# Patient Record
Sex: Female | Born: 1985 | Race: Black or African American | Hispanic: No | Smoking: Former smoker
Health system: Southern US, Community
[De-identification: ages and names within clinical notes are randomized; demographics above are authoritative.]

## PROBLEM LIST (undated history)

## (undated) DIAGNOSIS — R51 Headache: Secondary | ICD-10-CM

## (undated) DIAGNOSIS — R519 Headache, unspecified: Secondary | ICD-10-CM

## (undated) DIAGNOSIS — N809 Endometriosis, unspecified: Secondary | ICD-10-CM

## (undated) HISTORY — PX: APPENDECTOMY: SHX54

## (undated) HISTORY — PX: DIAGNOSTIC LAPAROSCOPY: SUR761

## (undated) HISTORY — PX: TUBAL LIGATION: SHX77

---

## 2015-01-07 ENCOUNTER — Emergency Department
Admit: 2015-01-07 | Disposition: A | Discharge status: Home / Self Care | Payer: Self-pay | Admitting: Emergency Medicine

## 2015-01-07 LAB — COMPREHENSIVE METABOLIC PANEL
ALBUMIN: 4.1 g/dL
ALK PHOS: 63 U/L
ALT: 19 U/L
Anion Gap: 7 (ref 7–16)
BILIRUBIN TOTAL: 0.2 mg/dL — AB
BUN: 8 mg/dL
CHLORIDE: 107 mmol/L
CO2: 24 mmol/L
CREATININE: 0.76 mg/dL
Calcium, Total: 9.4 mg/dL
EGFR (African American): >60
EGFR (Non-African Amer.): >60
GLUCOSE: 90 mg/dL
POTASSIUM: 3.9 mmol/L
SGOT(AST): 20 U/L
Sodium: 138 mmol/L
Total Protein: 7.7 g/dL

## 2015-01-07 LAB — CBC WITH DIFFERENTIAL/PLATELET
BASOS ABS: 0.1 10*3/uL (ref 0.0–0.1)
Basophil %: 0.5 %
Eosinophil #: 0.1 10*3/uL (ref 0.0–0.7)
Eosinophil %: 1 %
HCT: 41.3 % (ref 35.0–47.0)
HGB: 13.5 g/dL (ref 12.0–16.0)
LYMPHS PCT: 24.8 %
Lymphocyte #: 3.3 10*3/uL (ref 1.0–3.6)
MCH: 29.1 pg (ref 26.0–34.0)
MCHC: 32.7 g/dL (ref 32.0–36.0)
MCV: 89 fL (ref 80–100)
MONOS PCT: 6.4 %
Monocyte #: 0.8 x10 3/mm (ref 0.2–0.9)
Neutrophil #: 8.8 10*3/uL — ABNORMAL HIGH (ref 1.4–6.5)
Neutrophil %: 67.3 %
Platelet: 263 10*3/uL (ref 150–440)
RBC: 4.63 10*6/uL (ref 3.80–5.20)
RDW: 14.1 % (ref 11.5–14.5)
WBC: 13.1 10*3/uL — AB (ref 3.6–11.0)

## 2015-01-07 LAB — URINALYSIS, COMPLETE
BILIRUBIN, UR: NEGATIVE
Blood: NEGATIVE
Glucose,UR: NEGATIVE mg/dL (ref 0–75)
KETONE: NEGATIVE
Leukocyte Esterase: NEGATIVE
Nitrite: NEGATIVE
PROTEIN: NEGATIVE
Ph: 6 (ref 4.5–8.0)
SPECIFIC GRAVITY: 1.024 (ref 1.003–1.030)

## 2015-01-07 LAB — TROPONIN I: Troponin-I: <0.03 ng/mL

## 2015-01-07 LAB — LIPASE, BLOOD: LIPASE: 30 U/L

## 2015-03-24 ENCOUNTER — Emergency Department: Payer: Commercial Managed Care - PPO

## 2015-03-24 ENCOUNTER — Emergency Department
Admission: EM | Admit: 2015-03-24 | Discharge: 2015-03-24 | Disposition: A | Discharge status: Home / Self Care | Payer: Commercial Managed Care - PPO | Attending: Emergency Medicine | Admitting: Emergency Medicine

## 2015-03-24 ENCOUNTER — Encounter: Payer: Self-pay | Admitting: *Deleted

## 2015-03-24 DIAGNOSIS — N8320 Unspecified ovarian cysts: Secondary | ICD-10-CM | POA: Diagnosis not present

## 2015-03-24 DIAGNOSIS — N83209 Unspecified ovarian cyst, unspecified side: Secondary | ICD-10-CM

## 2015-03-24 DIAGNOSIS — Z3202 Encounter for pregnancy test, result negative: Secondary | ICD-10-CM | POA: Insufficient documentation

## 2015-03-24 DIAGNOSIS — R1031 Right lower quadrant pain: Secondary | ICD-10-CM

## 2015-03-24 DIAGNOSIS — Z720 Tobacco use: Secondary | ICD-10-CM | POA: Insufficient documentation

## 2015-03-24 DIAGNOSIS — Z8742 Personal history of other diseases of the female genital tract: Secondary | ICD-10-CM

## 2015-03-24 HISTORY — DX: Endometriosis, unspecified: N80.9

## 2015-03-24 LAB — COMPREHENSIVE METABOLIC PANEL
ALBUMIN: 3.7 g/dL (ref 3.5–5.0)
ALT: 21 U/L (ref 14–54)
AST: 19 U/L (ref 15–41)
Alkaline Phosphatase: 59 U/L (ref 38–126)
Anion gap: 7 (ref 5–15)
BUN: 9 mg/dL (ref 6–20)
CO2: 24 mmol/L (ref 22–32)
Calcium: 9 mg/dL (ref 8.9–10.3)
Chloride: 109 mmol/L (ref 101–111)
Creatinine, Ser: 0.61 mg/dL (ref 0.44–1.00)
GFR calc Af Amer: >60 mL/min (ref >60)
GFR calc non Af Amer: >60 mL/min (ref >60)
GLUCOSE: 92 mg/dL (ref 65–99)
Potassium: 4 mmol/L (ref 3.5–5.1)
SODIUM: 140 mmol/L (ref 135–145)
TOTAL PROTEIN: 8 g/dL (ref 6.5–8.1)
Total Bilirubin: 0.3 mg/dL (ref 0.3–1.2)

## 2015-03-24 LAB — URINALYSIS COMPLETE WITH MICROSCOPIC (ARMC ONLY)
Bacteria, UA: NONE SEEN
Bilirubin Urine: NEGATIVE
Glucose, UA: NEGATIVE mg/dL
HGB URINE DIPSTICK: NEGATIVE
LEUKOCYTES UA: NEGATIVE
NITRITE: NEGATIVE
PH: 5 (ref 5.0–8.0)
PROTEIN: NEGATIVE mg/dL
Specific Gravity, Urine: 1.019 (ref 1.005–1.030)

## 2015-03-24 LAB — CBC WITH DIFFERENTIAL/PLATELET
BASOS ABS: 0.1 10*3/uL (ref 0–0.1)
BASOS PCT: 1 %
EOS ABS: 0.1 10*3/uL (ref 0–0.7)
Eosinophils Relative: 1 %
HCT: 43.1 % (ref 35.0–47.0)
Hemoglobin: 14 g/dL (ref 12.0–16.0)
Lymphocytes Relative: 22 %
Lymphs Abs: 2.2 10*3/uL (ref 1.0–3.6)
MCH: 28.8 pg (ref 26.0–34.0)
MCHC: 32.6 g/dL (ref 32.0–36.0)
MCV: 88.4 fL (ref 80.0–100.0)
Monocytes Absolute: 0.7 10*3/uL (ref 0.2–0.9)
Monocytes Relative: 7 %
Neutro Abs: 7.1 10*3/uL — ABNORMAL HIGH (ref 1.4–6.5)
Neutrophils Relative %: 69 %
PLATELETS: 247 10*3/uL (ref 150–440)
RBC: 4.87 MIL/uL (ref 3.80–5.20)
RDW: 14.3 % (ref 11.5–14.5)
WBC: 10.2 10*3/uL (ref 3.6–11.0)

## 2015-03-24 LAB — WET PREP, GENITAL
Clue Cells Wet Prep HPF POC: NONE SEEN
TRICH WET PREP: NONE SEEN
Yeast Wet Prep HPF POC: NONE SEEN

## 2015-03-24 LAB — CHLAMYDIA/NGC RT PCR (ARMC ONLY)
Chlamydia Tr: NOT DETECTED
N gonorrhoeae: NOT DETECTED

## 2015-03-24 LAB — LIPASE, BLOOD: LIPASE: 27 U/L (ref 22–51)

## 2015-03-24 LAB — POCT PREGNANCY, URINE: PREG TEST UR: NEGATIVE

## 2015-03-24 LAB — PREGNANCY, URINE: Preg Test, Ur: NEGATIVE

## 2015-03-24 MED ORDER — HYDROMORPHONE HCL 1 MG/ML IJ SOLN
1.0000 mg | Freq: Once | INTRAMUSCULAR | Status: AC
  Administered 2015-03-24: 1 mg via INTRAVENOUS

## 2015-03-24 MED ORDER — OXYCODONE-ACETAMINOPHEN 5-325 MG PO TABS
ORAL_TABLET | ORAL | Status: AC
  Filled 2015-03-24: qty 1

## 2015-03-24 MED ORDER — OXYCODONE-ACETAMINOPHEN 5-325 MG PO TABS
1.0000 | ORAL_TABLET | Freq: Once | ORAL | Status: AC
  Administered 2015-03-24: 1 via ORAL

## 2015-03-24 MED ORDER — ONDANSETRON HCL 4 MG/2ML IJ SOLN
INTRAMUSCULAR | Status: AC
  Administered 2015-03-24: 4 mg via INTRAVENOUS
  Filled 2015-03-24: qty 2

## 2015-03-24 MED ORDER — KETOROLAC TROMETHAMINE 30 MG/ML IJ SOLN
30.0000 mg | Freq: Once | INTRAMUSCULAR | Status: AC
  Administered 2015-03-24: 30 mg via INTRAVENOUS

## 2015-03-24 MED ORDER — ONDANSETRON HCL 4 MG/2ML IJ SOLN
4.0000 mg | Freq: Once | INTRAMUSCULAR | Status: AC
  Administered 2015-03-24: 4 mg via INTRAVENOUS

## 2015-03-24 MED ORDER — HYDROMORPHONE HCL 1 MG/ML IJ SOLN
INTRAMUSCULAR | Status: AC
  Administered 2015-03-24: 1 mg via INTRAVENOUS
  Filled 2015-03-24: qty 1

## 2015-03-24 MED ORDER — OXYCODONE-ACETAMINOPHEN 5-325 MG PO TABS: 1.0000 | ORAL_TABLET | ORAL | Status: DC | PRN

## 2015-03-24 MED ORDER — KETOROLAC TROMETHAMINE 30 MG/ML IJ SOLN
INTRAMUSCULAR | Status: AC
  Administered 2015-03-24: 30 mg via INTRAVENOUS
  Filled 2015-03-24: qty 1

## 2015-03-24 MED ORDER — IBUPROFEN 600 MG PO TABS: 600.0000 mg | ORAL_TABLET | Freq: Three times a day (TID) | ORAL | Status: DC | PRN

## 2015-03-24 NOTE — ED Notes (Signed)
Pt c/o RLQ abdominal pain. Pt has hx of endometriosis and associates this pain with endometriosis. Pt has not taken any meds for her pain.

## 2015-03-24 NOTE — ED Notes (Signed)
Patient transported to Ultrasound 

## 2015-03-24 NOTE — ED Provider Notes (Signed)
[facility] Emergency Department Provider Note   ____________________________________________  Time seen: 7:35 AM I have reviewed the triage vital signs and the triage nursing note.  HISTORY  Chief Complaint Abdominal Pain   Historian Patient and her husband  HPI Krystal Reeves is a 29 y.o. female who woke up around 5 AM having sharp shooting pains in her right lower quadrant suprapubically. She has had this a few years, but it is similar to when she's had endometriosis pains in the past. She is new to the area and does not have a local OB/GYN physician. She has had her appendix out. She started her gallbladder. She's had a little bit of nausea but no vomiting. She's had no constipation or diarrhea. She's had no fever. She had no vaginal discharge.    Past Medical History  Diagnosis Date  . Endometriosis     There are no active problems to display for this patient.   Past Surgical History  Procedure Laterality Date  . Appendectomy    . Tubal ligation Bilateral     Current Outpatient Rx  Name  Route  Sig  Dispense  Refill  . ibuprofen (ADVIL,MOTRIN) 600 MG tablet   Oral   Take 1 tablet (600 mg total) by mouth every 8 (eight) hours as needed.   20 tablet   0   . oxyCODONE-acetaminophen (ROXICET) 5-325 MG per tablet   Oral   Take 1 tablet by mouth every 4 (four) hours as needed for severe pain.   10 tablet   0     Allergies Review of patient's allergies indicates no known allergies.  History reviewed. No pertinent family history.  Social History History  Substance Use Topics  . Smoking status: Current Every Day Smoker    Types: Cigarettes  . Smokeless tobacco: Never Used  . Alcohol Use: Yes     Comment: occasionally    Review of Systems  Constitutional: Negative for fever. Eyes: Negative for visual changes. ENT: Negative for sore throat. Cardiovascular: Negative for chest pain. Respiratory: Negative for shortness of  breath. Gastrointestinal: Negative for vomiting and diarrhea. Genitourinary: Negative for dysuria. Musculoskeletal: Negative for back pain. Skin: Negative for rash. Neurological: Negative for headaches, focal weakness or numbness. 10 point Review of Systems otherwise negative ____________________________________________   PHYSICAL EXAM:  VITAL SIGNS: ED Triage Vitals  Enc Vitals Group     BP 03/24/15 0542 117/76 mmHg     Pulse Rate 03/24/15 0542 80     Resp --      Temp 03/24/15 0542 98.1 F (36.7 C)     Temp Source 03/24/15 0542 Oral     SpO2 03/24/15 0542 97 %     Weight --      Height --      Head Cir --      Peak Flow --      Pain Score 03/24/15 0607 10     Pain Loc --      Pain Edu? --      Excl. in GC? --      Constitutional: Alert and oriented. Well appearing and in no distress. Eyes: Conjunctivae are normal. PERRL. Normal extraocular movements. ENT   Head: Normocephalic and atraumatic.   Nose: No congestion/rhinnorhea.   Mouth/Throat: Mucous membranes are moist.   Neck: No stridor. Cardiovascular/Chest: Normal rate, regular rhythm.  No murmurs, rubs, or gallops. Respiratory: Normal respiratory effort without tachypnea nor retractions. Breath sounds are clear and equal bilaterally. No wheezes/rales/rhonchi. Gastrointestinal: Soft.  No distention, no guarding, no rebound. Moderate tenderness suprapubically and right lower quadrant.  Genitourinary/rectal: Mild Fleites vaginal discharge. Mild right adnexal tenderness but no cervical motion tenderness. Musculoskeletal: Nontender with normal range of motion in all extremities. No joint effusions.  No lower extremity tenderness nor edema. Neurologic:  Normal speech and language. No gross focal neurologic deficits are appreciated. Skin:  Skin is warm, dry and intact. No rash noted. Psychiatric: Mood and affect are normal. Speech and behavior are normal. Patient exhibits appropriate insight and  judgment.  ____________________________________________   EKG I, Governor Rooksebecca Milicent Acheampong, MD, the attending physician have personally viewed and interpreted all ECGs.  None ____________________________________________  LABS (pertinent positives/negatives)  CBC within normal limits CMP within normal limits Pregnancy test negative Wet prep with few G[place]man blood cells but otherwise negative Urinalysis negative for urinary tract infection, trace ketones  ____________________________________________  RADIOLOGY All Xrays were viewed by me. Imaging interpreted by Radiologist.  Ultrasound pelvic and transvaginal:  IMPRESSION: 1. No evidence of ovarian torsion at this time. 2. Suspect collapsed cyst in the right ovary with small volume free fluid adjacent to the right at adnexa. These imaging findings suggest the possibility of either recent ovulation, or cyst rupture. __________________________________________  PROCEDURES  Procedure(s) performed: None Critical Care performed: None  ____________________________________________   ED COURSE / ASSESSMENT AND PLAN  CONSULTATIONS: None  Pertinent labs & imaging results that were available during my care of the patient were reviewed by me and considered in my medical decision making (see chart for details).   Overall well-appearing patient who got some relief with pain control here in the emergency department. Laboratory evaluation is unremarkable and reassuring. Her ultrasound shows likely recent cyst rupture or ovulation. She has previously had her appendix out.   I will refer her to a local OB/GYN for follow-up.   Patient / Family / Caregiver informed of clinical course, medical decision-making process, and agree with plan.   I discussed return precautions, follow-up instructions, and discharged instructions with patient and/or family.  ___________________________________________   FINAL CLINICAL IMPRESSION(S) / ED  DIAGNOSES   Final diagnoses:  Right lower quadrant pain  Ovarian cyst rupture  History of endometriosis    FOLLOW UP  Referred to: OB/GYN   Governor Rooksebecca Kazzandra Desaulniers, MD 03/24/15 1018

## 2015-03-24 NOTE — ED Notes (Signed)
Pt uprite on stretcher in exam room, appears uncomfortable, grimacing; husband at bedside; pt reports awoke with right lower pelvic pain, nonradiating, no accomp symptoms; st hx endometriosis and "feels same"; st since April has had irregular periods; has recently moved here and has no GYN doctor

## 2015-03-24 NOTE — Discharge Instructions (Signed)
No certain cause was found for your pelvic pain, however I suspect this may be related to a recent ovarian cyst rupture, due to the findings suggestive of this on your ultrasound. Return to the emergency department for any new or worsening abdominal pain, black or bloody stools, vomiting, fever, or any other symptoms concerning to you. You need to follow with OB/GYN doctor, and you are referred to Dr. Jean Rosenthal.   Ovarian Cyst An ovarian cyst is a sac filled with fluid or blood. This sac is attached to the ovary. Some cysts go away on their own. Other cysts need treatment.  HOME CARE   Only take medicine as told by your doctor.  Follow up with your doctor as told.  Get regular pelvic exams and Pap tests. GET HELP IF:  Your periods are late, not regular, or painful.  You stop having periods.  Your belly (abdominal) or pelvic pain does not go away.  Your belly becomes large or puffy (swollen).  You have a hard time peeing (totally emptying your bladder).  You have pressure on your bladder.  You have pain during sex.  You feel fullness, pressure, or discomfort in your belly.  You lose weight for no reason.  You feel sick most of the time.  You have a hard time pooping (constipation).  You do not feel like eating.  You develop pimples (acne).  You have an increase in hair on your body and face.  You are gaining weight for no reason.  You think you are pregnant. GET HELP RIGHT AWAY IF:   Your belly pain gets worse.  You feel sick to your stomach (nauseous), and you throw up (vomit).  You have a fever that comes on fast.  You have belly pain while pooping (bowel movement).  Your periods are heavier than usual. MAKE SURE YOU:   Understand these instructions.  Will watch your condition.  Will get help right away if you are not doing well or get worse. Document Released: 03/01/2008 Document Revised: 07/04/2013 Document Reviewed: 05/21/2013 [facility] Patient  Information 2015 [place], [place]. This information is not intended to replace advice given to you by your health care provider. Make sure you discuss any questions you have with your health care provider.  Abdominal Pain, Women Abdominal (stomach, pelvic, or belly) pain can be caused by many things. It is important to tell your doctor:  The location of the pain.  Does it come and go or is it present all the time?  Are there things that start the pain (eating certain foods, exercise)?  Are there other symptoms associated with the pain (fever, nausea, vomiting, diarrhea)? All of this is helpful to know when trying to find the cause of the pain. CAUSES   Stomach: virus or bacteria infection, or ulcer.  Intestine: appendicitis (inflamed appendix), regional ileitis (Crohn's disease), ulcerative colitis (inflamed colon), irritable bowel syndrome, diverticulitis (inflamed diverticulum of the colon), or cancer of the stomach or intestine.  Gallbladder disease or stones in the gallbladder.  Kidney disease, kidney stones, or infection.  Pancreas infection or cancer.  Fibromyalgia (pain disorder).  Diseases of the female organs:  Uterus: fibroid (non-cancerous) tumors or infection.  Fallopian tubes: infection or tubal pregnancy.  Ovary: cysts or tumors.  Pelvic adhesions (scar tissue).  Endometriosis (uterus lining tissue growing in the pelvis and on the pelvic organs).  Pelvic congestion syndrome (female organs filling up with blood just before the menstrual period).  Pain with the menstrual period.  Pain  with ovulation (producing an egg).  Pain with an IUD (intrauterine device, birth control) in the uterus.  Cancer of the female organs.  Functional pain (pain not caused by a disease, may improve without treatment).  Psychological pain.  Depression. DIAGNOSIS  Your doctor will decide the seriousness of your pain by doing an examination.  Blood  tests.  X-rays.  Ultrasound.  CT scan (computed tomography, special type of X-ray).  MRI (magnetic resonance imaging).  Cultures, for infection.  Barium enema (dye inserted in the large intestine, to better view it with X-rays).  Colonoscopy (looking in intestine with a lighted tube).  Laparoscopy (minor surgery, looking in abdomen with a lighted tube).  Major abdominal exploratory surgery (looking in abdomen with a large incision). TREATMENT  The treatment will depend on the cause of the pain.   Many cases can be observed and treated at home.  Over-the-counter medicines recommended by your caregiver.  Prescription medicine.  Antibiotics, for infection.  Birth control pills, for painful periods or for ovulation pain.  Hormone treatment, for endometriosis.  Nerve blocking injections.  Physical therapy.  Antidepressants.  Counseling with a psychologist or psychiatrist.  Minor or major surgery. HOME CARE INSTRUCTIONS   Do not take laxatives, unless directed by your caregiver.  Take over-the-counter pain medicine only if ordered by your caregiver. Do not take aspirin because it can cause an upset stomach or bleeding.  Try a clear liquid diet (broth or water) as ordered by your caregiver. Slowly move to a bland diet, as tolerated, if the pain is related to the stomach or intestine.  Have a thermometer and take your temperature several times a day, and record it.  Bed rest and sleep, if it helps the pain.  Avoid sexual intercourse, if it causes pain.  Avoid stressful situations.  Keep your follow-up appointments and tests, as your caregiver orders.  If the pain does not go away with medicine or surgery, you may try:  Acupuncture.  Relaxation exercises (yoga, meditation).  Group therapy.  Counseling. SEEK MEDICAL CARE IF:   You notice certain foods cause stomach pain.  Your home care treatment is not helping your pain.  You need stronger pain  medicine.  You want your IUD removed.  You feel faint or lightheaded.  You develop nausea and vomiting.  You develop a rash.  You are having side effects or an allergy to your medicine. SEEK IMMEDIATE MEDICAL CARE IF:   Your pain does not go away or gets worse.  You have a fever.  Your pain is felt only in portions of the abdomen. The right side could possibly be appendicitis. The left lower portion of the abdomen could be colitis or diverticulitis.  You are passing blood in your stools (bright red or black tarry stools, with or without vomiting).  You have blood in your urine.  You develop chills, with or without a fever.  You pass out. MAKE SURE YOU:   Understand these instructions.  Will watch your condition.  Will get help right away if you are not doing well or get worse. Document Released: 07/11/2007 Document Revised: 01/28/2014 Document Reviewed: 07/31/2009 [facility] Patient Information 2015 [place], [place]. This information is not intended to replace advice given to you by your health care provider. Make sure you discuss any questions you have with your health care provider.

## 2015-04-23 ENCOUNTER — Encounter: Payer: Self-pay | Admitting: Emergency Medicine

## 2015-04-23 ENCOUNTER — Emergency Department: Payer: Commercial Managed Care - PPO

## 2015-04-23 ENCOUNTER — Emergency Department
Admission: EM | Admit: 2015-04-23 | Discharge: 2015-04-23 | Disposition: A | Discharge status: Home / Self Care | Payer: Commercial Managed Care - PPO | Attending: Student | Admitting: Student

## 2015-04-23 DIAGNOSIS — Z720 Tobacco use: Secondary | ICD-10-CM | POA: Diagnosis not present

## 2015-04-23 DIAGNOSIS — N8320 Unspecified ovarian cysts: Secondary | ICD-10-CM | POA: Diagnosis not present

## 2015-04-23 DIAGNOSIS — N83202 Unspecified ovarian cyst, left side: Secondary | ICD-10-CM

## 2015-04-23 DIAGNOSIS — Z9889 Other specified postprocedural states: Secondary | ICD-10-CM | POA: Insufficient documentation

## 2015-04-23 DIAGNOSIS — Z3202 Encounter for pregnancy test, result negative: Secondary | ICD-10-CM | POA: Insufficient documentation

## 2015-04-23 DIAGNOSIS — R1031 Right lower quadrant pain: Secondary | ICD-10-CM | POA: Diagnosis present

## 2015-04-23 DIAGNOSIS — R52 Pain, unspecified: Secondary | ICD-10-CM

## 2015-04-23 LAB — CBC WITH DIFFERENTIAL/PLATELET
BASOS PCT: 0 %
Basophils Absolute: 0.1 10*3/uL (ref 0–0.1)
Eosinophils Absolute: 0.1 10*3/uL (ref 0–0.7)
Eosinophils Relative: 1 %
HEMATOCRIT: 40.3 % (ref 35.0–47.0)
Hemoglobin: 13.4 g/dL (ref 12.0–16.0)
LYMPHS PCT: 19 %
Lymphs Abs: 2.3 10*3/uL (ref 1.0–3.6)
MCH: 29.2 pg (ref 26.0–34.0)
MCHC: 33.2 g/dL (ref 32.0–36.0)
MCV: 87.8 fL (ref 80.0–100.0)
Monocytes Absolute: 0.6 10*3/uL (ref 0.2–0.9)
Monocytes Relative: 5 %
Neutro Abs: 9.4 10*3/uL — ABNORMAL HIGH (ref 1.4–6.5)
Neutrophils Relative %: 75 %
Platelets: 233 10*3/uL (ref 150–440)
RBC: 4.59 MIL/uL (ref 3.80–5.20)
RDW: 14.3 % (ref 11.5–14.5)
WBC: 12.4 10*3/uL — AB (ref 3.6–11.0)

## 2015-04-23 LAB — COMPREHENSIVE METABOLIC PANEL
ALT: 14 U/L (ref 14–54)
AST: 17 U/L (ref 15–41)
Albumin: 3.9 g/dL (ref 3.5–5.0)
Alkaline Phosphatase: 62 U/L (ref 38–126)
Anion gap: 10 (ref 5–15)
BUN: 7 mg/dL (ref 6–20)
CO2: 21 mmol/L — ABNORMAL LOW (ref 22–32)
Calcium: 9.1 mg/dL (ref 8.9–10.3)
Chloride: 105 mmol/L (ref 101–111)
Creatinine, Ser: 0.7 mg/dL (ref 0.44–1.00)
GFR calc Af Amer: >60 mL/min (ref >60)
GFR calc non Af Amer: >60 mL/min (ref >60)
GLUCOSE: 90 mg/dL (ref 65–99)
POTASSIUM: 3.4 mmol/L — AB (ref 3.5–5.1)
Sodium: 136 mmol/L (ref 135–145)
TOTAL PROTEIN: 7.7 g/dL (ref 6.5–8.1)
Total Bilirubin: 0.5 mg/dL (ref 0.3–1.2)

## 2015-04-23 LAB — URINALYSIS COMPLETE WITH MICROSCOPIC (ARMC ONLY)
BACTERIA UA: NONE SEEN
Bilirubin Urine: NEGATIVE
GLUCOSE, UA: NEGATIVE mg/dL
Hgb urine dipstick: NEGATIVE
Leukocytes, UA: NEGATIVE
NITRITE: NEGATIVE
PROTEIN: NEGATIVE mg/dL
SPECIFIC GRAVITY, URINE: 1.027 (ref 1.005–1.030)
pH: 5 (ref 5.0–8.0)

## 2015-04-23 LAB — POCT PREGNANCY, URINE: Preg Test, Ur: NEGATIVE

## 2015-04-23 MED ORDER — MORPHINE SULFATE 4 MG/ML IJ SOLN
INTRAMUSCULAR | Status: AC
  Filled 2015-04-23: qty 1

## 2015-04-23 MED ORDER — SODIUM CHLORIDE 0.9 % IV BOLUS (SEPSIS)
1000.0000 mL | Freq: Once | INTRAVENOUS | Status: AC
  Administered 2015-04-23: 1000 mL via INTRAVENOUS

## 2015-04-23 MED ORDER — ONDANSETRON HCL 4 MG/2ML IJ SOLN
4.0000 mg | Freq: Once | INTRAMUSCULAR | Status: AC
  Administered 2015-04-23: 4 mg via INTRAVENOUS

## 2015-04-23 MED ORDER — ONDANSETRON HCL 4 MG/2ML IJ SOLN
4.0000 mg | Freq: Once | INTRAMUSCULAR | Status: AC
  Administered 2015-04-23: 4 mg via INTRAVENOUS
  Filled 2015-04-23: qty 2

## 2015-04-23 MED ORDER — OXYCODONE HCL 5 MG PO TABS
5.0000 mg | ORAL_TABLET | Freq: Four times a day (QID) | ORAL | Status: DC | PRN
Start: 2015-04-23 — End: 2015-05-13

## 2015-04-23 MED ORDER — MORPHINE SULFATE 4 MG/ML IJ SOLN
4.0000 mg | Freq: Once | INTRAMUSCULAR | Status: AC
  Administered 2015-04-23: 4 mg via INTRAVENOUS
  Filled 2015-04-23: qty 1

## 2015-04-23 MED ORDER — MORPHINE SULFATE 4 MG/ML IJ SOLN
4.0000 mg | Freq: Once | INTRAMUSCULAR | Status: AC
  Administered 2015-04-23: 4 mg via INTRAVENOUS

## 2015-04-23 MED ORDER — ONDANSETRON HCL 4 MG/2ML IJ SOLN
INTRAMUSCULAR | Status: AC
  Filled 2015-04-23: qty 2

## 2015-04-23 NOTE — ED Notes (Signed)
C/o RLQ abd. Pain that started today, states she feels like she has a "knot" in her RLQ area, also having some nausea but no vomiting, hx of endometriosis

## 2015-04-23 NOTE — ED Provider Notes (Signed)
[facility] Emergency Department Provider Note  ____________________________________________  Time seen: Approximately 5:40 PM  I have reviewed the triage vital signs and the nursing notes.   HISTORY  Chief Complaint Abdominal Pain and Nausea    HPI Krystal Reeves is a 29 y.o. female with history of endometriosis, status post appendectomy, who presents for evaluation of gradual onset sharp right lower quadrant pain today at noon, constant since onset. Associated with nausea but no vomiting, diarrhea or fever. No abnormal vaginal bleeding or vaginal discharge. She has not had a menstrual cycle since the end of June but reports that she has a history of severe irregularity when it comes to mitral cycles. Current severity of symptoms is severe area no modifying factors. She had similar pain when seen here on 03/24/2015 and had ultrasound consistent with possible ruptured cyst.   Past Medical History  Diagnosis Date  . Endometriosis     There are no active problems to display for this patient.   Past Surgical History  Procedure Laterality Date  . Appendectomy    . Tubal ligation Bilateral     Current Outpatient Rx  Name  Route  Sig  Dispense  Refill  . oxyCODONE-acetaminophen (ROXICET) 5-325 MG per tablet   Oral   Take 1 tablet by mouth every 4 (four) hours as needed for severe pain.   10 tablet   0   . ibuprofen (ADVIL,MOTRIN) 600 MG tablet   Oral   Take 1 tablet (600 mg total) by mouth every 8 (eight) hours as needed. Patient not taking: Reported on 04/23/2015   20 tablet   0     Allergies Review of patient's allergies indicates no known allergies.  No family history on file.  Social History History  Substance Use Topics  . Smoking status: Current Every Day Smoker    Types: Cigarettes  . Smokeless tobacco: Never Used  . Alcohol Use: Yes     Comment: occasionally    Review of Systems Constitutional: No fever/chills Eyes: No visual  changes. ENT: No sore throat. Cardiovascular: Denies chest pain. Respiratory: Denies shortness of breath. Gastrointestinal: + abdominal pain.  + nausea, no vomiting.  No diarrhea.  No constipation. Genitourinary: Negative for dysuria. Musculoskeletal: Negative for back pain. Skin: Negative for rash. Neurological: Negative for headaches, focal weakness or numbness.  10-point ROS otherwise negative.  ____________________________________________   PHYSICAL EXAM:  VITAL SIGNS: ED Triage Vitals  Enc Vitals Group     BP 04/23/15 1706 119/71 mmHg     Pulse Rate 04/23/15 1706 83     Resp 04/23/15 1706 18     Temp 04/23/15 1706 98.8 F (37.1 C)     Temp Source 04/23/15 1706 Oral     SpO2 04/23/15 1706 98 %     Weight 04/23/15 1706 190 lb (86.183 kg)     Height 04/23/15 1706 5\' 6"  (1.676 m)     Head Cir --      Peak Flow --      Pain Score 04/23/15 1707 8     Pain Loc --      Pain Edu? --      Excl. in GC? --     Constitutional: Alert and oriented. In mild distress secondary to pain. Eyes: Conjunctivae are normal. PERRL. EOMI. Head: Atraumatic. Nose: No congestion/rhinnorhea. Mouth/Throat: Mucous membranes are moist.  Oropharynx non-erythematous. Neck: No stridor.  Cardiovascular: Normal rate, regular rhythm. Grossly normal heart sounds.  Good peripheral circulation. Respiratory: Normal respiratory  effort.  No retractions. Lungs CTAB. Gastrointestinal: Soft with moderate RLQ tenderness and mild suprapubic tenderness. No distention. No abdominal bruits. No CVA tenderness. Genitourinary: deferrred Musculoskeletal: No lower extremity tenderness nor edema.  No joint effusions. Neurologic:  Normal speech and language. No gross focal neurologic deficits are appreciated. No gait instability. Skin:  Skin is warm, dry and intact. No rash noted. Psychiatric: Mood and affect are normal. Speech and behavior are normal.  ____________________________________________   LABS (all labs  ordered are listed, but only abnormal results are displayed)  Labs Reviewed  CBC WITH DIFFERENTIAL/PLATELET - Abnormal; Notable for the following:    WBC 12.4 (*)    Neutro Abs 9.4 (*)    All other components within normal limits  COMPREHENSIVE METABOLIC PANEL - Abnormal; Notable for the following:    Potassium 3.4 (*)    CO2 21 (*)    All other components within normal limits  URINALYSIS COMPLETEWITH MICROSCOPIC ([facility] ONLY) - Abnormal; Notable for the following:    Color, Urine YELLOW (*)    APPearance HAZY (*)    Ketones, ur TRACE (*)    Squamous Epithelial / LPF 0-5 (*)    All other components within normal limits  POCT PREGNANCY, URINE   ____________________________________________  EKG  none ____________________________________________  RADIOLOGY  Transvaginal ultrasound IMPRESSION: 1. 1.6 x 2 x 1.9 cm complex left ovarian cystic mass without internal Doppler flow which may reflect an endometrioma versus hemorrhagic cyst. 2. No ovarian torsion.   ____________________________________________   PROCEDURES  Procedure(s) performed: None  Critical Care performed: No  ____________________________________________   INITIAL IMPRESSION / ASSESSMENT AND PLAN / ED COURSE  Pertinent labs & imaging results that were available during my care of the patient were reviewed by me and considered in my medical decision making (see chart for details).  Krystal Reeves is a 29 y.o. female with history of endometriosis, status post appendectomy, who presents for evaluation of gradual onset sharp right lower quadrant pain. On exam, she is in mild distress second to pain. She has tenderness to palpation in the right lower quadrant and in the suprapubic region. Vital signs are stable, she is afebrile. Labs notable for mild leukocytosis. Negative pregnancy. UA not consistent with infection. Plan for transvaginal ultrasound to further evaluate cause of her pain/rule out torsion. We'll  treat her pain, give IV fluids, antiemetics. Reassess for disposition.  ----------------------------------------- 8:18 PM on 04/23/2015 -----------------------------------------  Pain improved significantly. Patient appears much more comfortable sitting up in bed, she is texting on her cell phone. Transvaginal ultrasound is negative for any evidence of torsion or abscess. A new complex left ovarian cystic mass is noted which may reflect endometrioma versus hemorrhagic cyst. This is new when compared to transvaginal ultrasound obtained one month ago and though it is left-sided, suspect her symptoms are related to endometriosis given similarity to prior endometriosis pain. I do not appreciate any bulge/hernia sac in the right groin or right lower quadrant, she is status post appendectomy, I do not think she requires CT of the abdomen and pelvis at this time. Discussed meticulous return precautions, need for close OB/GYN follow-up, pain control with oxycodone as prescribed. She voices understanding and is comfortable with the discharge plan. ____________________________________________   FINAL CLINICAL IMPRESSION(S) / ED DIAGNOSES  Final diagnoses:  Pain  RLQ abdominal pain  Cyst of left ovary      Gayla Doss, MD 04/23/15 2022

## 2015-05-13 ENCOUNTER — Encounter: Payer: Self-pay | Admitting: Emergency Medicine

## 2015-05-13 ENCOUNTER — Emergency Department: Payer: Commercial Managed Care - PPO

## 2015-05-13 ENCOUNTER — Emergency Department
Admission: EM | Admit: 2015-05-13 | Discharge: 2015-05-13 | Disposition: A | Discharge status: Home / Self Care | Payer: Commercial Managed Care - PPO | Attending: Emergency Medicine | Admitting: Emergency Medicine

## 2015-05-13 DIAGNOSIS — G8929 Other chronic pain: Secondary | ICD-10-CM | POA: Diagnosis not present

## 2015-05-13 DIAGNOSIS — R102 Pelvic and perineal pain: Secondary | ICD-10-CM

## 2015-05-13 DIAGNOSIS — R1030 Lower abdominal pain, unspecified: Secondary | ICD-10-CM | POA: Diagnosis present

## 2015-05-13 DIAGNOSIS — Z720 Tobacco use: Secondary | ICD-10-CM | POA: Diagnosis not present

## 2015-05-13 DIAGNOSIS — N809 Endometriosis, unspecified: Secondary | ICD-10-CM | POA: Diagnosis not present

## 2015-05-13 DIAGNOSIS — Z3202 Encounter for pregnancy test, result negative: Secondary | ICD-10-CM | POA: Insufficient documentation

## 2015-05-13 LAB — URINALYSIS COMPLETE WITH MICROSCOPIC (ARMC ONLY)
Bacteria, UA: NONE SEEN
Bilirubin Urine: NEGATIVE
Glucose, UA: NEGATIVE mg/dL
Hgb urine dipstick: NEGATIVE
KETONES UR: NEGATIVE mg/dL
Leukocytes, UA: NEGATIVE
Nitrite: NEGATIVE
PH: 8 (ref 5.0–8.0)
Protein, ur: NEGATIVE mg/dL
Specific Gravity, Urine: 1.015 (ref 1.005–1.030)

## 2015-05-13 LAB — CBC
HCT: 41.5 % (ref 35.0–47.0)
Hemoglobin: 13.8 g/dL (ref 12.0–16.0)
MCH: 29.1 pg (ref 26.0–34.0)
MCHC: 33.2 g/dL (ref 32.0–36.0)
MCV: 87.8 fL (ref 80.0–100.0)
PLATELETS: 244 10*3/uL (ref 150–440)
RBC: 4.73 MIL/uL (ref 3.80–5.20)
RDW: 14.4 % (ref 11.5–14.5)
WBC: 8.4 10*3/uL (ref 3.6–11.0)

## 2015-05-13 LAB — COMPREHENSIVE METABOLIC PANEL
ALK PHOS: 62 U/L (ref 38–126)
ALT: 15 U/L (ref 14–54)
AST: 18 U/L (ref 15–41)
Albumin: 3.6 g/dL (ref 3.5–5.0)
Anion gap: 6 (ref 5–15)
BUN: 9 mg/dL (ref 6–20)
CO2: 25 mmol/L (ref 22–32)
CREATININE: 0.63 mg/dL (ref 0.44–1.00)
Calcium: 8.8 mg/dL — ABNORMAL LOW (ref 8.9–10.3)
Chloride: 104 mmol/L (ref 101–111)
Glucose, Bld: 94 mg/dL (ref 65–99)
Potassium: 3.8 mmol/L (ref 3.5–5.1)
Sodium: 135 mmol/L (ref 135–145)
Total Bilirubin: 0.6 mg/dL (ref 0.3–1.2)
Total Protein: 7.4 g/dL (ref 6.5–8.1)

## 2015-05-13 LAB — PREGNANCY, URINE: Preg Test, Ur: NEGATIVE

## 2015-05-13 LAB — LIPASE, BLOOD: Lipase: 16 U/L — ABNORMAL LOW (ref 22–51)

## 2015-05-13 MED ORDER — KETOROLAC TROMETHAMINE 30 MG/ML IJ SOLN
30.0000 mg | Freq: Once | INTRAMUSCULAR | Status: AC
  Administered 2015-05-13: 30 mg via INTRAVENOUS

## 2015-05-13 MED ORDER — IBUPROFEN 800 MG PO TABS: 800.0000 mg | ORAL_TABLET | Freq: Three times a day (TID) | ORAL | Status: DC | PRN

## 2015-05-13 MED ORDER — KETOROLAC TROMETHAMINE 30 MG/ML IJ SOLN
INTRAMUSCULAR | Status: AC
  Filled 2015-05-13: qty 1

## 2015-05-13 MED ORDER — OXYCODONE-ACETAMINOPHEN 5-325 MG PO TABS: 1.0000 | ORAL_TABLET | Freq: Four times a day (QID) | ORAL | Status: DC | PRN

## 2015-05-13 MED ORDER — MORPHINE SULFATE (PF) 4 MG/ML IV SOLN
4.0000 mg | Freq: Once | INTRAVENOUS | Status: AC
  Administered 2015-05-13: 4 mg via INTRAVENOUS
  Filled 2015-05-13: qty 1

## 2015-05-13 MED ORDER — SODIUM CHLORIDE 0.9 % IV BOLUS (SEPSIS)
1000.0000 mL | Freq: Once | INTRAVENOUS | Status: AC
  Administered 2015-05-13: 1000 mL via INTRAVENOUS

## 2015-05-13 NOTE — Discharge Instructions (Signed)
Take motrin for pain.   Take percocet for severe pain. Do NOT drive with it.   Follow up with your GYN doctor regarding treatment options.  Return to ER if you have worse pain, vomiting, fevers.

## 2015-05-13 NOTE — ED Notes (Signed)
Pt has negative pregnancy test, [place] called and made aware

## 2015-05-13 NOTE — ED Notes (Signed)
Pt to ed with c/o abd pain x 2 weeks, also reports nausea and diarrhea, but denies vomiting. Pt states hx of endometriosis.

## 2015-05-13 NOTE — ED Provider Notes (Signed)
CSN: [id number]     Arrival date & time 05/13/15  4098 History   First MD Initiated Contact with Patient 05/13/15 1026     Chief Complaint  Patient presents with  . Abdominal Pain     (Consider location/radiation/quality/duration/timing/severity/associated sxs/prior Treatment) The history is provided by the patient.  Krystal Reeves is a 29 y.o. female hx of endometriosis here with lower abdominal pain. Patient has hx of endometriosis and has chronic pelvic pain. Intermittent lower abdominal pain for the last 2 weeks. Seen 2 weeks ago and had endometrioma on the left side. Prescribed percocet, which helped somewhat. Denies vomiting or fever or urinary symptoms. Has been trying to get follow up with GYN doctor.    Past Medical History  Diagnosis Date  . Endometriosis    Past Surgical History  Procedure Laterality Date  . Appendectomy    . Tubal ligation Bilateral    History reviewed. No pertinent family history. Social History  Substance Use Topics  . Smoking status: Current Every Day Smoker    Types: Cigarettes  . Smokeless tobacco: Never Used  . Alcohol Use: Yes     Comment: occasionally   OB History    Gravida Para Term Preterm AB TAB SAB Ectopic Multiple Living   Review of Systems  Gastrointestinal: Positive for abdominal pain.  All other systems reviewed and are negative.     Allergies  Review of patient's allergies indicates no known allergies.  Home Medications   Prior to Admission medications   Not on File   BP 116/96 mmHg  Pulse 59  Temp(Src) 98.1 F (36.7 C) (Oral)  Resp 18  Ht  (1.676 m)  Wt 190 lb (86.183 kg)  BMI 30.68 kg/m2  SpO2 98%  LMP 04/25/2015 Physical Exam  Constitutional: She is oriented to person, place, and time. She appears well-developed.  Uncomfortable   HENT:  Head: Normocephalic.  Mouth/Throat: Oropharynx is clear and moist.  Eyes: Conjunctivae are normal. Pupils are equal, round, and reactive to light.   Neck: Normal range of motion. Neck supple.  Cardiovascular: Normal rate, regular rhythm and normal heart sounds.   Pulmonary/Chest: Effort normal and breath sounds normal. No respiratory distress. She has no wheezes. She has no rales.  Abdominal: Soft. Bowel sounds are normal.  + diffuse lower abdominal tenderness, worse on L side, no rebound   Musculoskeletal: Normal range of motion. She exhibits no edema or tenderness.  Neurological: She is alert and oriented to person, place, and time. No cranial nerve deficit. Coordination normal.  Skin: Skin is warm and dry.  Psychiatric: She has a normal mood and affect. Her behavior is normal. Judgment and thought content normal.  Nursing note and vitals reviewed.   ED Course  Procedures (including critical care time) Labs Review Labs Reviewed  LIPASE, BLOOD  COMPREHENSIVE METABOLIC PANEL  CBC  URINALYSIS COMPLETEWITH MICROSCOPIC [facility] ONLY)  PREGNANCY, URINE    Imaging Review US Transvaginal Non-ob  05/13/2015   CLINICAL DATA:  Left pelvic pain. History of endometriosis. Last menstrual period 04/25/2015  EXAM: TRANSABDOMINAL AND TRANSVAGINAL ULTRASOUND OF PELVIS  DOPPLER ULTRASOUND OF OVARIES  TECHNIQUE: Both transabdominal and transvaginal ultrasound examinations of the pelvis were performed. Transabdominal technique was performed for global imaging of the pelvis including uterus, ovaries, adnexal regions, and pelvic cul-de-sac.  It was necessary to proceed with endovaginal exam following the transabdominal exam to visualize the endometrium and bilateral ovaries.  Color and duplex Doppler ultrasound was utilized to evaluate blood flow to the ovaries.  COMPARISON:  04/23/2015  FINDINGS: Uterus  Measurements: 9.3 x 5.2 x 5.3 cm. No fibroids or other mass visualized.  Endometrium  Thickness: 1.4 cm.  No focal abnormality visualized.  Right ovary  Measurements: 3.3 by 2.3 by 3.3 cm. Normal appearance / no adnexal mass.  Left ovary  Measurements: 3.8  by 2.3 by 3.8. The previously seen left ovarian thick-walled cystic structure now measures 1.9 x 1.3 x 1.9 cm, which represents a mild decrease in size. No intramural blood flow is seen.  Pulsed Doppler evaluation of both ovaries demonstrates normal low-resistance arterial and venous waveforms.  Other findings  There is a trace amount of free pelvic fluid.  IMPRESSION: Slightly decreased in size thick-walled cystic structure on the left ovary, which may represent a degenerating cyst versus less likely an endometrioma. Follow-up with a pelvic ultrasound in 6 months may be considered if found clinically necessary.  Normal appearance of the right ovary and the uterus.  Normal blood supply with appropriate waveforms of flow velocity of the ovaries.   Electronically Signed   By: Ted Mcalpine M.D.   On: 05/13/2015 13:34   US Pelvis Complete  05/13/2015   CLINICAL DATA:  Left pelvic pain. History of endometriosis. Last menstrual period 04/25/2015  EXAM: TRANSABDOMINAL AND TRANSVAGINAL ULTRASOUND OF PELVIS  DOPPLER ULTRASOUND OF OVARIES  TECHNIQUE: Both transabdominal and transvaginal ultrasound examinations of the pelvis were performed. Transabdominal technique was performed for global imaging of the pelvis including uterus, ovaries, adnexal regions, and pelvic cul-de-sac.  It was necessary to proceed with endovaginal exam following the transabdominal exam to visualize the endometrium and bilateral ovaries. Color and duplex Doppler ultrasound was utilized to evaluate blood flow to the ovaries.  COMPARISON:  04/23/2015  FINDINGS: Uterus  Measurements: 9.3 x 5.2 x 5.3 cm. No fibroids or other mass visualized.  Endometrium  Thickness: 1.4 cm.  No focal abnormality visualized.  Right ovary  Measurements: 3.3 by 2.3 by 3.3 cm. Normal appearance / no adnexal mass.  Left ovary  Measurements: 3.8 by 2.3 by 3.8. The previously seen left ovarian thick-walled cystic structure now measures 1.9 x 1.3 x 1.9 cm, which  represents a mild decrease in size. No intramural blood flow is seen.  Pulsed Doppler evaluation of both ovaries demonstrates normal low-resistance arterial and venous waveforms.  Other findings  There is a trace amount of free pelvic fluid.  IMPRESSION: Slightly decreased in size thick-walled cystic structure on the left ovary, which may represent a degenerating cyst versus less likely an endometrioma. Follow-up with a pelvic ultrasound in 6 months may be considered if found clinically necessary.  Normal appearance of the right ovary and the uterus.  Normal blood supply with appropriate waveforms of flow velocity of the ovaries.   Electronically Signed   By: Ted Mcalpine M.D.   On: 05/13/2015 13:34   [place] Art/ven Flow Abd Pelv Doppler  05/13/2015   CLINICAL DATA:  Left pelvic pain. History of endometriosis. Last menstrual period 04/25/2015  EXAM: TRANSABDOMINAL AND TRANSVAGINAL ULTRASOUND OF PELVIS  DOPPLER ULTRASOUND OF OVARIES  TECHNIQUE: Both transabdominal and transvaginal ultrasound examinations of the pelvis were performed. Transabdominal technique was performed for global imaging of the pelvis including uterus, ovaries, adnexal regions, and pelvic cul-de-sac.  It was necessary to proceed with endovaginal exam following the transabdominal exam to visualize the endometrium and bilateral ovaries. Color and duplex Doppler ultrasound was utilized to evaluate  blood flow to the ovaries.  COMPARISON:  04/23/2015  FINDINGS: Uterus  Measurements: 9.3 x 5.2 x 5.3 cm. No fibroids or other mass visualized.  Endometrium  Thickness: 1.4 cm.  No focal abnormality visualized.  Right ovary  Measurements: 3.3 by 2.3 by 3.3 cm. Normal appearance / no adnexal mass.  Left ovary  Measurements: 3.8 by 2.3 by 3.8. The previously seen left ovarian thick-walled cystic structure now measures 1.9 x 1.3 x 1.9 cm, which represents a mild decrease in size. No intramural blood flow is seen.  Pulsed Doppler evaluation of both ovaries  demonstrates normal low-resistance arterial and venous waveforms.  Other findings  There is a trace amount of free pelvic fluid.  IMPRESSION: Slightly decreased in size thick-walled cystic structure on the left ovary, which may represent a degenerating cyst versus less likely an endometrioma. Follow-up with a pelvic ultrasound in 6 months may be considered if found clinically necessary.  Normal appearance of the right ovary and the uterus.  Normal blood supply with appropriate waveforms of flow velocity of the ovaries.   Electronically Signed   By: Ted Mcalpine M.D.   On: 05/13/2015 13:34   I, YAO, DAVID, personally reviewed and evaluated these images and lab results as part of my medical decision-making.   EKG Interpretation None      MDM   Final diagnoses:  Endometriosis  Female pelvic pain   Krystal Reeves is a 29 y.o. female here with pelvic pain. Likely endometriosis. No vaginal bleeding or discharge. Will check labs, UA, [place].   2:20 PM US showed decreased cystic structure L ovary. Hg stable. No vaginal bleeding. UA nl. Pain controlled. Likely pain from endometriosis. Will dc home with pain meds, motrin, GYN f/u (she is in the process of finding one).    Richardean Canal, MD 05/13/15 [phone]

## 2015-05-27 ENCOUNTER — Encounter: Payer: Self-pay | Admitting: Emergency Medicine

## 2015-05-27 ENCOUNTER — Ambulatory Visit
Admission: EM | Admit: 2015-05-27 | Discharge: 2015-05-27 | Disposition: A | Discharge status: Home / Self Care | Payer: Commercial Managed Care - PPO | Attending: Family Medicine | Admitting: Family Medicine

## 2015-05-27 DIAGNOSIS — N926 Irregular menstruation, unspecified: Secondary | ICD-10-CM

## 2015-05-27 LAB — PREGNANCY, URINE: Preg Test, Ur: NEGATIVE

## 2015-05-27 NOTE — ED Provider Notes (Signed)
CSN: [id number]     Arrival date & time 05/27/15  1478 History   First MD Initiated Contact with Patient 05/27/15 [phone]     Chief Complaint  Patient presents with  . Possible Pregnancy   (Consider location/radiation/quality/duration/timing/severity/associated sxs/prior Treatment) HPI Comments: 29 yo female with a h/o a tubal ligation 7 years ago presents for "a pregnancy test" due to her period currently being abnormal. Patient states 4 days ago should have started her normal period but has only had some mild spotting. Usually has regular monthly periods and her last normal period July 29th. Denies nausea, vomiting, fevers, chills or other symptoms. Also reports a h/o endometriosis.   The history is provided by the patient.    Past Medical History  Diagnosis Date  . Endometriosis    Past Surgical History  Procedure Laterality Date  . Appendectomy    . Tubal ligation Bilateral    History reviewed. No pertinent family history. Social History  Substance Use Topics  . Smoking status: Current Every Day Smoker    Types: Cigarettes  . Smokeless tobacco: Never Used  . Alcohol Use: Yes     Comment: occasionally   OB History    Gravida Para Term Preterm AB TAB SAB Ectopic Multiple Living   Review of Systems  Allergies  Review of patient's allergies indicates no known allergies.  Home Medications   Prior to Admission medications   Medication Sig Start Date End Date Taking? Authorizing Provider  ibuprofen (ADVIL,MOTRIN) 800 MG tablet Take 1 tablet (800 mg total) by mouth every 8 (eight) hours as needed. 05/13/15   Richardean Canal, MD  oxyCODONE-acetaminophen (PERCOCET) 5-325 MG per tablet Take 1-2 tablets by mouth every 6 (six) hours as needed. 05/13/15   Richardean Canal, MD   Meds Ordered and Administered this Visit  Medications - No data to display  BP 126/94 mmHg  Pulse 71  Temp(Src) 97.6 F (36.4 C) (Tympanic)  Resp 16  Ht 5' (1.524 m)  Wt 200 lb (90.719 kg)   BMI 39.06 kg/m2  SpO2 100%  LMP 04/25/2015 No data found.   Physical Exam  Constitutional: She appears well-developed and well-nourished. No distress.  Pulmonary/Chest: Effort normal. No respiratory distress.  Neurological: She is alert.  Skin: She is not diaphoretic.  Psychiatric: She has a normal mood and affect. Her behavior is normal. Thought content normal.  Nursing note and vitals reviewed.   ED Course  Procedures (including critical care time)  Labs Review Labs Reviewed  PREGNANCY, URINE    Imaging Review No results found.   Visual Acuity Review  Right Eye Distance:   Left Eye Distance:   Bilateral Distance:    Right Eye Near:   Left Eye Near:    Bilateral Near:         MDM   1. Abnormal menses   ( mild spotting this week)  Plan: 1. Test results (negative urine pregnancy test) and diagnosis reviewed with patient 2. Since currently at anticipated menstrual period date, recommend continue to monitor symptoms and recheck a pregnancy test in 2-3 weeks. 3. F/u prn    Payton Mccallum, MD 05/27/15 [phone]

## 2015-05-27 NOTE — ED Notes (Signed)
Patient here for pregnancy test.  Patient states that she has only been spotting and has not had her next menstrual cycle.  Patient reports breast tenderness and nausea for the past 2 weeks.  Patient denies any abdominal pain.

## 2015-06-11 ENCOUNTER — Encounter: Payer: Self-pay | Admitting: Emergency Medicine

## 2015-06-11 ENCOUNTER — Emergency Department: Payer: Commercial Managed Care - PPO

## 2015-06-11 ENCOUNTER — Emergency Department
Admission: EM | Admit: 2015-06-11 | Discharge: 2015-06-12 | Disposition: A | Discharge status: Home / Self Care | Payer: Commercial Managed Care - PPO | Attending: Emergency Medicine | Admitting: Emergency Medicine

## 2015-06-11 DIAGNOSIS — R102 Pelvic and perineal pain: Secondary | ICD-10-CM | POA: Diagnosis present

## 2015-06-11 DIAGNOSIS — Z720 Tobacco use: Secondary | ICD-10-CM | POA: Insufficient documentation

## 2015-06-11 DIAGNOSIS — R1031 Right lower quadrant pain: Secondary | ICD-10-CM | POA: Insufficient documentation

## 2015-06-11 DIAGNOSIS — N809 Endometriosis, unspecified: Secondary | ICD-10-CM | POA: Insufficient documentation

## 2015-06-11 DIAGNOSIS — Z3202 Encounter for pregnancy test, result negative: Secondary | ICD-10-CM | POA: Diagnosis not present

## 2015-06-11 LAB — LIPASE, BLOOD: LIPASE: 28 U/L (ref 22–51)

## 2015-06-11 LAB — URINALYSIS COMPLETE WITH MICROSCOPIC (ARMC ONLY)
BACTERIA UA: NONE SEEN
Bilirubin Urine: NEGATIVE
Glucose, UA: NEGATIVE mg/dL
HGB URINE DIPSTICK: NEGATIVE
Ketones, ur: NEGATIVE mg/dL
LEUKOCYTES UA: NEGATIVE
NITRITE: NEGATIVE
PH: 6 (ref 5.0–8.0)
PROTEIN: NEGATIVE mg/dL
SPECIFIC GRAVITY, URINE: 1.019 (ref 1.005–1.030)

## 2015-06-11 LAB — COMPREHENSIVE METABOLIC PANEL
ALT: 14 U/L (ref 14–54)
AST: 20 U/L (ref 15–41)
Albumin: 3.6 g/dL (ref 3.5–5.0)
Alkaline Phosphatase: 64 U/L (ref 38–126)
Anion gap: 8 (ref 5–15)
BUN: 10 mg/dL (ref 6–20)
CHLORIDE: 107 mmol/L (ref 101–111)
CO2: 24 mmol/L (ref 22–32)
Calcium: 9.2 mg/dL (ref 8.9–10.3)
Creatinine, Ser: 0.77 mg/dL (ref 0.44–1.00)
GFR calc Af Amer: >60 mL/min (ref >60)
GFR calc non Af Amer: >60 mL/min (ref >60)
Glucose, Bld: 102 mg/dL — ABNORMAL HIGH (ref 65–99)
Potassium: 4 mmol/L (ref 3.5–5.1)
SODIUM: 139 mmol/L (ref 135–145)
Total Bilirubin: 0.4 mg/dL (ref 0.3–1.2)
Total Protein: 7.3 g/dL (ref 6.5–8.1)

## 2015-06-11 LAB — CBC
HEMATOCRIT: 42.3 % (ref 35.0–47.0)
HEMOGLOBIN: 13.9 g/dL (ref 12.0–16.0)
MCH: 29.1 pg (ref 26.0–34.0)
MCHC: 33 g/dL (ref 32.0–36.0)
MCV: 88.4 fL (ref 80.0–100.0)
Platelets: 239 10*3/uL (ref 150–440)
RBC: 4.78 MIL/uL (ref 3.80–5.20)
RDW: 14.3 % (ref 11.5–14.5)
WBC: 14.1 10*3/uL — AB (ref 3.6–11.0)

## 2015-06-11 LAB — POCT PREGNANCY, URINE: PREG TEST UR: NEGATIVE

## 2015-06-11 MED ORDER — ONDANSETRON HCL 4 MG/2ML IJ SOLN
INTRAMUSCULAR | Status: AC
  Administered 2015-06-11: 4 mg via INTRAVENOUS
  Filled 2015-06-11: qty 2

## 2015-06-11 MED ORDER — MORPHINE SULFATE (PF) 4 MG/ML IV SOLN
INTRAVENOUS | Status: AC
  Administered 2015-06-11: 4 mg via INTRAVENOUS
  Filled 2015-06-11: qty 1

## 2015-06-11 MED ORDER — HYDROMORPHONE HCL 1 MG/ML IJ SOLN
0.5000 mg | INTRAMUSCULAR | Status: DC | PRN
  Administered 2015-06-12: 0.5 mg via INTRAVENOUS
  Filled 2015-06-11: qty 1

## 2015-06-11 MED ORDER — HYDROMORPHONE HCL 1 MG/ML IJ SOLN
INTRAMUSCULAR | Status: AC
  Administered 2015-06-11: 1 mg via INTRAVENOUS
  Filled 2015-06-11: qty 1

## 2015-06-11 MED ORDER — MORPHINE SULFATE (PF) 4 MG/ML IV SOLN
4.0000 mg | Freq: Once | INTRAVENOUS | Status: AC
  Administered 2015-06-11: 4 mg via INTRAVENOUS

## 2015-06-11 MED ORDER — ONDANSETRON HCL 4 MG/2ML IJ SOLN
4.0000 mg | Freq: Once | INTRAMUSCULAR | Status: AC
  Administered 2015-06-11: 4 mg via INTRAVENOUS

## 2015-06-11 MED ORDER — HYDROMORPHONE HCL 1 MG/ML IJ SOLN
1.0000 mg | Freq: Once | INTRAMUSCULAR | Status: AC
  Administered 2015-06-11: 1 mg via INTRAVENOUS

## 2015-06-11 NOTE — ED Provider Notes (Signed)
[facility] Emergency Department Provider Note  ____________________________________________  Time seen: 2240  I have reviewed the triage vital signs and the nursing notes.   HISTORY  Chief Complaint Abdominal Pain pelvic pain, right side    HPI Krystal Reeves is a 29 y.o. female who presents with severe pain in the right lower quadrant, right pelvis. This worsened today. She has a history of similar. She has been seen in the emergency department multiple times with similar. Her last ultrasound was in the end of July which showed a cyst on the left ovary and no pathology on the right.   The patient has not been able to follow-up with gynecology. She reports she is working with her [employer] company to find a gynecologist within that network.She is now out of the Percocet that had previously been prescribed by emergency physicians.   She does report nausea. No diarrhea.  Past Medical History  Diagnosis Date  . Endometriosis     There are no active problems to display for this patient.   Past Surgical History  Procedure Laterality Date  . Appendectomy    . Tubal ligation Bilateral     Current Outpatient Rx  Name  Route  Sig  Dispense  Refill  . ibuprofen (ADVIL,MOTRIN) 800 MG tablet   Oral   Take 1 tablet (800 mg total) by mouth every 8 (eight) hours as needed.   30 tablet   0   . oxyCODONE-acetaminophen (PERCOCET) 5-325 MG per tablet   Oral   Take 1-2 tablets by mouth every 6 (six) hours as needed.   10 tablet   0     Allergies Review of patient's allergies indicates no known allergies.  No family history on file.  Social History Social History  Substance Use Topics  . Smoking status: Current Every Day Smoker    Types: Cigarettes  . Smokeless tobacco: Never Used  . Alcohol Use: Yes     Comment: occasionally    Review of Systems  Constitutional: Negative for fever. ENT: Negative for sore throat. Cardiovascular:  Negative for chest pain. Respiratory: Negative for shortness of breath. Gastrointestinal: Negative for abdominal pain, vomiting and diarrhea. Genitourinary: history of endometriosis, pelvic pain. See history of present illness Musculoskeletal: No myalgias or injuries. Skin: Negative for rash. Neurological: Negative for headaches   10-point ROS otherwise negative.  ____________________________________________   PHYSICAL EXAM:  VITAL SIGNS: ED Triage Vitals  Enc Vitals Group     BP 06/11/15 2027 119/65 mmHg     Pulse Rate 06/11/15 2027 86     Resp --      Temp 06/11/15 2027 98.2 F (36.8 C)     Temp Source 06/11/15 2027 Oral     SpO2 06/11/15 2027 97 %     Weight 06/11/15 2027 200 lb (90.719 kg)     Height 06/11/15 2027 5\' 6"  (1.676 m)     Head Cir --      Peak Flow --      Pain Score 06/11/15 2027 8     Pain Loc --      Pain Edu? --      Excl. in GC? --     Constitutional: Alert and oriented. Patient is uncomfortable with mild to moderate distress. ENT   Head: Normocephalic and atraumatic.   Nose: No congestion/rhinnorhea. Cardiovascular: Normal rate, regular rhythm, no murmur noted Respiratory:  Normal respiratory effort, no tachypnea.    Breath sounds are clear and equal bilaterally.  Gastrointestinal: Soft with notable tenderness in the right lower quadrant. She has no peritoneal signs. No distention.  Back: No muscle spasm, no tenderness, no CVA tenderness. Musculoskeletal: No deformity noted. Nontender with normal range of motion in all extremities.  No noted edema. Neurologic:  Normal speech and language. No gross focal neurologic deficits are appreciated.  Skin:  Skin is warm, dry. No rash noted. Psychiatric: Mood and affect are normal. Speech and behavior are normal.  ____________________________________________    LABS (pertinent positives/negatives)  Labs Reviewed  COMPREHENSIVE METABOLIC PANEL - Abnormal; Notable for the following:    Glucose,  Bld 102 (*)    All other components within normal limits  CBC - Abnormal; Notable for the following:    WBC 14.1 (*)    All other components within normal limits  URINALYSIS COMPLETEWITH MICROSCOPIC ([facility] ONLY) - Abnormal; Notable for the following:    Color, Urine YELLOW (*)    APPearance CLEAR (*)    Squamous Epithelial / LPF 0-5 (*)    All other components within normal limits  LIPASE, BLOOD  POC URINE PREG, ED  POCT PREGNANCY, URINE     ____________________________________________  RADIOLOGY  Pelvic ultrasound: Pending  ____________________________________________  ____________________________________________   INITIAL IMPRESSION / ASSESSMENT AND PLAN / ED COURSE  Pertinent labs & imaging results that were available during my care of the patient were reviewed by me and considered in my medical decision making (see chart for details).  29 year old female with a history of endometriosis, diagnosed a few years ago in [facility] by laparoscopy.  Patient now with significant pain, similar to her prior pain, but now more focal on the right. We will obtain an ultrasound to ensure she does not have hemorrhagic cyst or ovarian torsion or other pathology.  She has received 4 mg of morphine IV. She is continuing to have pain and we will treat her with 0.5 mg of Dilaudid with 1 repeat as needed.  I will ask my colleague Bayard Males to assume care and follow-up on her ultrasound.  ____________________________________________   FINAL CLINICAL IMPRESSION(S) / ED DIAGNOSES  Final diagnoses:  Pelvic pain in female  Endometriosis      Darien Ramus, MD 06/11/15 2300

## 2015-06-11 NOTE — ED Notes (Signed)
Pt to triage via w/c with no distress noted; pt reports right lower abd pain since yesterday accomp by nausea; st hx endometriosis

## 2015-06-12 MED ORDER — OXYCODONE-ACETAMINOPHEN 5-325 MG PO TABS: 1.0000 | ORAL_TABLET | ORAL | Status: DC | PRN

## 2015-06-12 NOTE — ED Notes (Signed)

## 2015-06-12 NOTE — ED Provider Notes (Signed)
I assumed care of the patient documents 11:00 PM. Pelvic Ultrasound revealed    US Transvaginal Non-OB (Final result) Result time: 06/12/15 00:53:53   Final result by Rad Results In Interface (06/12/15 00:53:53)   Narrative:   CLINICAL DATA: Pelvic pain, history of endometriosis.  EXAM: TRANSABDOMINAL AND TRANSVAGINAL ULTRASOUND OF PELVIS  DOPPLER ULTRASOUND OF OVARIES  TECHNIQUE: Both transabdominal and transvaginal ultrasound examinations of the pelvis were performed. Transabdominal technique was performed for global imaging of the pelvis including uterus, ovaries, adnexal regions, and pelvic cul-de-sac.  It was necessary to proceed with endovaginal exam following the transabdominal exam to visualize the uterus, endometrium, left and right ovary. Color and duplex Doppler ultrasound was utilized to evaluate blood flow to the ovaries.  COMPARISON: Pelvic ultrasound 05/13/2015, 04/23/2015, 03/24/2015  FINDINGS: Uterus  Measurements: 7.8 x 4.7 x 6.0 cm. No fibroids or other mass visualized.  Endometrium  Thickness: 10 mm. No focal abnormality visualized.  Right ovary  Measurements: 4.1 x 2.6 x 2.8 cm. There are physiologic follicles. There is a follicular cyst measuring 2.2 cm. No adnexal mass. Normal blood flow.  Left ovary  Measurements: 4.8 x 3.0 x 2.8 cm. Multiple physiologic follicles. The previous complex cyst currently appears of simple fluid echogenicity measuring 2 cm, there is trace minimal residual wall irregularity. No adnexal mass. Normal blood flow.  Pulsed Doppler evaluation of both ovaries demonstrates normal low-resistance arterial and venous waveforms.  Other findings  No free fluid.  IMPRESSION: 1. Probable degenerating cyst in the left ovary with minimal residual wall irregularity but otherwise no internal complexity. No further follow-up is needed. 2. Normal blood flow to both ovaries without torsion. Normal appearance of the  uterus.   Electronically Signed By: Rubye Oaks M.D.   I evaluated patient pain is resolved following receiving IV analgesic medication. Patient states symptoms are consistent with previous endometriosis exacerbation.  Darci Current, MD 06/12/15 [phone]

## 2015-06-12 NOTE — Discharge Instructions (Signed)
Pelvic Pain Female pelvic pain can be caused by many different things and start from a variety of places. Pelvic pain refers to pain that is located in the lower half of the abdomen and between your hips. The pain may occur over a short period of time (acute) or may be reoccurring (chronic). The cause of pelvic pain may be related to disorders affecting the female reproductive organs (gynecologic), but it may also be related to the bladder, kidney stones, an intestinal complication, or muscle or skeletal problems. Getting help right away for pelvic pain is important, especially if there has been severe, sharp, or a sudden onset of unusual pain. It is also important to get help right away because some types of pelvic pain can be life threatening.  CAUSES  Below are only some of the causes of pelvic pain. The causes of pelvic pain can be in one of several categories.   Gynecologic.  Pelvic inflammatory disease.  Sexually transmitted infection.  Ovarian cyst or a twisted ovarian ligament (ovarian torsion).  Uterine lining that grows outside the uterus (endometriosis).  Fibroids, cysts, or tumors.  Ovulation.  Pregnancy.  Pregnancy that occurs outside the uterus (ectopic pregnancy).  Miscarriage.  Labor.  Abruption of the placenta or ruptured uterus.  Infection.  Uterine infection (endometritis).  Bladder infection.  Diverticulitis.  Miscarriage related to a uterine infection (septic abortion).  Bladder.  Inflammation of the bladder (cystitis).  Kidney stone(s).  Gastrointestinal.  Constipation.  Diverticulitis.  Neurologic.  Trauma.  Feeling pelvic pain because of mental or emotional causes (psychosomatic).  Cancers of the bowel or pelvis. EVALUATION  Your caregiver will want to take a careful history of your concerns. This includes recent changes in your health, a careful gynecologic history of your periods (menses), and a sexual history. Obtaining your family  history and medical history is also important. Your caregiver may suggest a pelvic exam. A pelvic exam will help identify the location and severity of the pain. It also helps in the evaluation of which organ system may be involved. In order to identify the cause of the pelvic pain and be properly treated, your caregiver may order tests. These tests may include:   A pregnancy test.  Pelvic ultrasonography.  An X-ray exam of the abdomen.  A urinalysis or evaluation of vaginal discharge.  Blood tests. HOME CARE INSTRUCTIONS   Only take over-the-counter or prescription medicines for pain, discomfort, or fever as directed by your caregiver.   Rest as directed by your caregiver.   Eat a balanced diet.   Drink enough fluids to make your urine clear or pale yellow, or as directed.   Avoid sexual intercourse if it causes pain.   Apply warm or cold compresses to the lower abdomen depending on which one helps the pain.   Avoid stressful situations.   Keep a journal of your pelvic pain. Write down when it started, where the pain is located, and if there are things that seem to be associated with the pain, such as food or your menstrual cycle.  Follow up with your caregiver as directed.  SEEK MEDICAL CARE IF:  Your medicine does not help your pain.  You have abnormal vaginal discharge. SEEK IMMEDIATE MEDICAL CARE IF:   You have heavy bleeding from the vagina.   Your pelvic pain increases.   You feel light-headed or faint.   You have chills.   You have pain with urination or blood in your urine.   You have uncontrolled diarrhea   or vomiting.   You have a fever or persistent symptoms for more than 3 days.  You have a fever and your symptoms suddenly get worse.   You are being physically or sexually abused.  MAKE SURE YOU:  Understand these instructions.  Will watch your condition.  Will get help if you are not doing well or get worse. Document Released:  08/10/2004 Document Revised: 01/28/2014 Document Reviewed: 01/03/2012 [employer] Patient Information 2015 [employer]. This information is not intended to replace advice given to you by your health care provider. Make sure you discuss any questions you have with your health care provider.  

## 2015-07-07 ENCOUNTER — Emergency Department
Admission: EM | Admit: 2015-07-07 | Discharge: 2015-07-07 | Disposition: A | Discharge status: Home / Self Care | Payer: Commercial Managed Care - PPO | Attending: Emergency Medicine | Admitting: Emergency Medicine

## 2015-07-07 ENCOUNTER — Encounter: Payer: Self-pay | Admitting: Emergency Medicine

## 2015-07-07 DIAGNOSIS — N940 Mittelschmerz: Secondary | ICD-10-CM | POA: Insufficient documentation

## 2015-07-07 DIAGNOSIS — N809 Endometriosis, unspecified: Secondary | ICD-10-CM

## 2015-07-07 DIAGNOSIS — Z720 Tobacco use: Secondary | ICD-10-CM | POA: Insufficient documentation

## 2015-07-07 DIAGNOSIS — N83202 Unspecified ovarian cyst, left side: Secondary | ICD-10-CM | POA: Insufficient documentation

## 2015-07-07 MED ORDER — OXYCODONE-ACETAMINOPHEN 5-325 MG PO TABS: 1.0000 | ORAL_TABLET | Freq: Four times a day (QID) | ORAL | Status: DC | PRN

## 2015-07-07 MED ORDER — NAPROXEN 500 MG PO TABS: 500.0000 mg | ORAL_TABLET | Freq: Two times a day (BID) | ORAL | Status: DC

## 2015-07-07 MED ORDER — OXYCODONE-ACETAMINOPHEN 5-325 MG PO TABS
1.0000 | ORAL_TABLET | Freq: Once | ORAL | Status: AC
Start: 2015-07-07 — End: 2015-07-07
  Administered 2015-07-07: 1 via ORAL
  Filled 2015-07-07: qty 1

## 2015-07-07 NOTE — Discharge Instructions (Signed)
Endometriosis Endometriosis is a condition in which the tissue that lines the uterus (endometrium) grows outside of its normal location. The tissue may grow in many locations close to the uterus, but it commonly grows on the ovaries, fallopian tubes, vagina, or bowel. Because the uterus expels, or sheds, its lining every menstrual cycle, there is bleeding wherever the endometrial tissue is located. This can cause pain because blood is irritating to tissues not normally exposed to it.  CAUSES  The cause of endometriosis is not known.  SIGNS AND SYMPTOMS  Often, there are no symptoms. When symptoms are present, they can vary with the location of the displaced tissue. Various symptoms can occur at different times. Although symptoms occur mainly during a woman's menstrual period, they can also occur midcycle and usually stop with menopause. Some people may go months with no symptoms at all. Symptoms may include:   Back or abdominal pain.   Heavier bleeding during periods.   Pain during intercourse.   Painful bowel movements.   Infertility. DIAGNOSIS  Your health care provider will do a physical exam and ask about your symptoms. Various tests may be done, such as:   Blood tests and urine tests. These are done to help rule out other problems.   Ultrasound. This test is done to look for abnormal tissue.   An X-ray of the lower bowel (barium enema).  Laparoscopy. In this procedure, a thin, lighted tube with a tiny camera on the end (laparoscope) is inserted into your abdomen. This helps your health care provider look for abnormal tissue to confirm the diagnosis. The health care provider may also remove a small piece of tissue (biopsy) from any abnormal tissue found. This tissue sample can then be sent to a lab so it can be looked at under a microscope. TREATMENT  Treatment will vary and may include:   Medicines to relieve pain. Nonsteroidal anti-inflammatory drugs (NSAIDs) are a type of  pain medicine that can help to relieve the pain caused by endometriosis.  Hormonal therapy. When using hormonal therapy, periods are eliminated. This eliminates the monthly exposure to blood by the displaced endometrial tissue.   Surgery. Surgery may sometimes be done to remove the abnormal endometrial tissue. In severe cases, surgery may be done to remove the fallopian tubes, uterus, and ovaries (hysterectomy). HOME CARE INSTRUCTIONS   Take all medicines as directed by your health care provider. Do not take aspirin because it may increase bleeding when you are not on hormonal therapy.   Avoid activities that produce pain, including sexual activity. SEEK MEDICAL CARE IF:  You have pelvic pain before, after, or during your periods.  You have pelvic pain between periods that gets worse during your period.  You have pelvic pain during or after sex.  You have pelvic pain with bowel movements or urination, especially during your period.  You have problems getting pregnant.  You have a fever. SEEK IMMEDIATE MEDICAL CARE IF:   Your pain is severe and is not responding to pain medicine.   You have severe nausea and vomiting, or you cannot keep foods down.   You have pain that is limited to the right lower part of your abdomen.   You have swelling or increasing pain in your abdomen.   You see blood in your stool.  MAKE SURE YOU:   Understand these instructions.  Will watch your condition.  Will get help right away if you are not doing well or get worse.   This information   is not intended to replace advice given to you by your health care provider. Make sure you discuss any questions you have with your health care provider.   Document Released: 09/10/2000 Document Revised: 10/04/2014 Document Reviewed: 05/11/2013 Elsevier Interactive Patient Education 2016 [employer]  

## 2015-07-07 NOTE — ED Provider Notes (Signed)
[facility] Emergency Department Provider Note  ____________________________________________  Time seen: Approximately 4:33 PM  I have reviewed the triage vital signs and the nursing notes.   HISTORY  Chief Complaint Abdominal Pain    HPI Krystal Reeves is a 29 y.o. female who presents to the emergency department complaining of lower abdominal pain. The patient has a history of left ovarian cyst and endometriosis. The patient states that the symptoms are exactly the same as prior visits but that she has run out of her medication. The patient states that she has an appointment set up with her OB/GYN for laparoscopic surgery for symptoms. The patient states that the pain is present for several days in a row and then will dissipate. She states that the previously prescribed medications help however she has run out.The patient states that the pain is sharp and located in the pelvic region. States that it is constant. No fever/chills, no nausea/vomiting, no diarrhea or constipation, no vaginal discharge or bleeding.   Past Medical History  Diagnosis Date  . Endometriosis     There are no active problems to display for this patient.   Past Surgical History  Procedure Laterality Date  . Appendectomy    . Tubal ligation Bilateral     Current Outpatient Rx  Name  Route  Sig  Dispense  Refill  . ibuprofen (ADVIL,MOTRIN) 800 MG tablet   Oral   Take 1 tablet (800 mg total) by mouth every 8 (eight) hours as needed.   30 tablet   0   . naproxen (NAPROSYN) 500 MG tablet   Oral   Take 1 tablet (500 mg total) by mouth 2 (two) times daily with a meal.   60 tablet   2   . oxyCODONE-acetaminophen (ROXICET) 5-325 MG tablet   Oral   Take 1 tablet by mouth every 6 (six) hours as needed for severe pain.   20 tablet   0     Allergies Review of patient's allergies indicates no known allergies.  History reviewed. No pertinent family history.  Social  History Social History  Substance Use Topics  . Smoking status: Current Every Day Smoker -- 0.50 packs/day    Types: Cigarettes  . Smokeless tobacco: Never Used  . Alcohol Use: Yes     Comment: occasionally    Review of Systems Constitutional: No fever/chills Eyes: No visual changes. ENT: No sore throat. Cardiovascular: Denies chest pain. Respiratory: Denies shortness of breath. Gastrointestinal: Endorses lower abdominal pain.  No nausea, no vomiting.  No diarrhea.  No constipation. Genitourinary: Negative for dysuria. Musculoskeletal: Negative for back pain. Skin: Negative for rash. Neurological: Negative for headaches, focal weakness or numbness.  10-point ROS otherwise negative.  ____________________________________________   PHYSICAL EXAM:  VITAL SIGNS: ED Triage Vitals  Enc Vitals Group     BP 07/07/15 1621 128/94 mmHg     Pulse Rate 07/07/15 1621 78     Resp 07/07/15 1621 16     Temp 07/07/15 1621 98.7 F (37.1 C)     Temp Source 07/07/15 1621 Oral     SpO2 07/07/15 1621 99 %     Weight --      Height --      Head Cir --      Peak Flow --      Pain Score 07/07/15 1550 0     Pain Loc --      Pain Edu? --      Excl. in GC? --  Constitutional: Alert and oriented. Well appearing and in no acute distress. Eyes: Conjunctivae are normal. PERRL. EOMI. Head: Atraumatic. Nose: No congestion/rhinnorhea. Mouth/Throat: Mucous membranes are moist.  Oropharynx non-erythematous. Neck: No stridor.   Cardiovascular: Normal rate, regular rhythm. Grossly normal heart sounds.  Good peripheral circulation. Respiratory: Normal respiratory effort.  No retractions. Lungs CTAB. Gastrointestinal: Soft to palpation. Tender to deep palpation in the suprapubic region. Slightly more tender on the left side. No distention. No abdominal bruits. No CVA tenderness. Musculoskeletal: No lower extremity tenderness nor edema.  No joint effusions. Neurologic:  Normal speech and language.  No gross focal neurologic deficits are appreciated. No gait instability. Skin:  Skin is warm, dry and intact. No rash noted. Psychiatric: Mood and affect are normal. Speech and behavior are normal.  ____________________________________________   LABS (all labs ordered are listed, but only abnormal results are displayed)  Labs Reviewed - No data to display ____________________________________________  EKG   ____________________________________________  RADIOLOGY   ____________________________________________   PROCEDURES  Procedure(s) performed: None  Critical Care performed: No  ____________________________________________   INITIAL IMPRESSION / ASSESSMENT AND PLAN / ED COURSE  Pertinent labs & imaging results that were available during my care of the patient were reviewed by me and considered in my medical decision making (see chart for details).  The patient endorses lower quadrant abdominal pain. The patient has been seen here on a monthly basis for same symptoms. The patient reports that approximately 2 weeks prior to her. She experiences these pains. The timing is appropriate with mittelschmerz. The patient does have a known history of endometriosis and left ovarian cyst. At this time I will not order an ultrasound as this matches both location of symptoms, and timing of previous symptoms. The patient is being followed by OB/GYN she states that she has laparoscopic procedure scheduled with them "in a few weeks." At this time I will refill patient's medications and discharged home. I gave patient strict instructions to return to the emergency department should any symptoms worsen that she verbalizes understanding. Otherwise the patient is to follow-up with her OB/GYN for her scheduled procedure. ____________________________________________   FINAL CLINICAL IMPRESSION(S) / ED DIAGNOSES  Final diagnoses:  Endometriosis  Ovarian cyst, left  Mittelschmerz       Racheal Patches, PA-C 07/07/15 1651  Jene Every, MD 07/08/15 1404

## 2015-07-07 NOTE — ED Notes (Signed)
Pt presents with lower abdominal pain. Pt states she has a history of endometriosis but she is out of her medication. Pt states she usually takes Motrin 800 mg or Percocet.

## 2015-07-11 ENCOUNTER — Other Ambulatory Visit: Payer: Commercial Managed Care - PPO

## 2015-07-11 ENCOUNTER — Encounter: Payer: Self-pay | Admitting: *Deleted

## 2015-07-11 NOTE — Patient Instructions (Signed)
  Your procedure is scheduled on: 07-22-15 Report to [facility] 2ND FLOOR To find out your arrival time please call [phone] between 1PM - 3PM on 07-21-15  Remember: Instructions that are not followed completely may result in serious medical risk, up to and including death, or upon the discretion of your surgeon and anesthesiologist your surgery may need to be rescheduled.    _X___ 1. Do not eat food or drink liquids after midnight. No gum chewing or hard candies.     _X___ 2. No Alcohol for 24 hours before or after surgery.   ____ 3. Bring all medications with you on the day of surgery if instructed.    ____ 4. Notify your doctor if there is any change in your medical condition     (cold, fever, infections).     Do not wear jewelry, make-up, hairpins, clips or nail polish.  Do not wear lotions, powders, or perfumes. You may wear deodorant.  Do not shave 48 hours prior to surgery. Men may shave face and neck.  Do not bring valuables to the hospital.    [facility] belongings or valuables.               Contacts, dentures or bridgework may not be worn into surgery.  Leave your suitcase in the car. After surgery it may be brought to your room.  For patients admitted to the hospital, discharge time is determined by your treatment team.   Patients discharged the day of surgery will not be allowed to drive home.   Please read over the following fact sheets that you were given:      ____ Take these medicines the morning of surgery with A SIP OF WATER:    1. NONE  2.   3.   4.  5.  6.  ____ Fleet Enema (as directed)   ____ Use CHG Soap as directed  ____ Use inhalers on the day of surgery  ____ Stop metformin 2 days prior to surgery    ____ Take 1/2 of usual insulin dose the night before surgery and none on the morning of surgery.   ____ Stop Coumadin/Plavix/aspirin-N/A  _X___ Stop Anti-inflammatories-STOP  IBUPROFEN/NAPROXEN 7 DAYS PRIOR TO SURGERY-TYLENOL OK   ____ Stop supplements until after surgery.    ____ Bring C-Pap to the hospital.

## 2015-07-22 ENCOUNTER — Ambulatory Visit: Payer: Commercial Managed Care - PPO | Admitting: Anesthesiology

## 2015-07-22 ENCOUNTER — Encounter: Payer: Self-pay | Admitting: *Deleted

## 2015-07-22 ENCOUNTER — Ambulatory Visit
Admission: RE | Admit: 2015-07-22 | Discharge: 2015-07-22 | Disposition: A | Discharge status: Home / Self Care | Payer: Commercial Managed Care - PPO | Source: Ambulatory Visit | Attending: Obstetrics and Gynecology | Admitting: Obstetrics and Gynecology

## 2015-07-22 ENCOUNTER — Encounter
Admission: RE | Disposition: A | Discharge status: Home / Self Care | Payer: Self-pay | Source: Ambulatory Visit | Attending: Obstetrics and Gynecology

## 2015-07-22 DIAGNOSIS — F17210 Nicotine dependence, cigarettes, uncomplicated: Secondary | ICD-10-CM | POA: Insufficient documentation

## 2015-07-22 DIAGNOSIS — N803 Endometriosis of pelvic peritoneum: Secondary | ICD-10-CM | POA: Insufficient documentation

## 2015-07-22 DIAGNOSIS — Z9889 Other specified postprocedural states: Secondary | ICD-10-CM

## 2015-07-22 DIAGNOSIS — R102 Pelvic and perineal pain: Secondary | ICD-10-CM | POA: Diagnosis not present

## 2015-07-22 DIAGNOSIS — G8929 Other chronic pain: Secondary | ICD-10-CM | POA: Insufficient documentation

## 2015-07-22 HISTORY — PX: LAPAROSCOPY: SHX197

## 2015-07-22 HISTORY — DX: Headache: R51

## 2015-07-22 HISTORY — DX: Headache, unspecified: R51.9

## 2015-07-22 LAB — POCT PREGNANCY, URINE: Preg Test, Ur: NEGATIVE

## 2015-07-22 SURGERY — LAPAROSCOPY, DIAGNOSTIC
Anesthesia: General | Wound class: Clean Contaminated

## 2015-07-22 MED ORDER — FENTANYL CITRATE (PF) 100 MCG/2ML IJ SOLN
INTRAMUSCULAR | Status: DC | PRN
  Administered 2015-07-22 (×2): 50 ug via INTRAVENOUS

## 2015-07-22 MED ORDER — FENTANYL CITRATE (PF) 100 MCG/2ML IJ SOLN
INTRAMUSCULAR | Status: DC
Start: 2015-07-22 — End: 2015-07-22
  Filled 2015-07-22: qty 2

## 2015-07-22 MED ORDER — LACTATED RINGERS IV SOLN
INTRAVENOUS | Status: DC
  Administered 2015-07-22: 10:00:00 via INTRAVENOUS

## 2015-07-22 MED ORDER — ACETAMINOPHEN 10 MG/ML IV SOLN
INTRAVENOUS | Status: DC | PRN
  Administered 2015-07-22: 1000 mg via INTRAVENOUS

## 2015-07-22 MED ORDER — OXYCODONE-ACETAMINOPHEN 5-325 MG PO TABS
1.0000 | ORAL_TABLET | ORAL | Status: DC | PRN
  Administered 2015-07-22: 1 via ORAL

## 2015-07-22 MED ORDER — FAMOTIDINE 20 MG PO TABS
ORAL_TABLET | ORAL | Status: AC
  Filled 2015-07-22: qty 1

## 2015-07-22 MED ORDER — BUPIVACAINE HCL (PF) 0.5 % IJ SOLN
INTRAMUSCULAR | Status: AC
  Filled 2015-07-22: qty 30

## 2015-07-22 MED ORDER — FENTANYL CITRATE (PF) 100 MCG/2ML IJ SOLN
25.0000 ug | INTRAMUSCULAR | Status: AC | PRN
  Administered 2015-07-22 (×6): 25 ug via INTRAVENOUS

## 2015-07-22 MED ORDER — GLYCOPYRROLATE 0.2 MG/ML IJ SOLN
INTRAMUSCULAR | Status: DC | PRN
  Administered 2015-07-22: 0.4 mg via INTRAVENOUS
  Administered 2015-07-22: 0.2 mg via INTRAVENOUS

## 2015-07-22 MED ORDER — FAMOTIDINE 20 MG PO TABS
20.0000 mg | ORAL_TABLET | Freq: Once | ORAL | Status: AC
  Administered 2015-07-22: 20 mg via ORAL

## 2015-07-22 MED ORDER — ROCURONIUM BROMIDE 100 MG/10ML IV SOLN
INTRAVENOUS | Status: DC | PRN
  Administered 2015-07-22: 5 mg via INTRAVENOUS
  Administered 2015-07-22: 10 mg via INTRAVENOUS

## 2015-07-22 MED ORDER — PROPOFOL 10 MG/ML IV BOLUS
INTRAVENOUS | Status: DC | PRN
  Administered 2015-07-22: 200 mg via INTRAVENOUS

## 2015-07-22 MED ORDER — NEOSTIGMINE METHYLSULFATE 10 MG/10ML IV SOLN
INTRAVENOUS | Status: DC | PRN
  Administered 2015-07-22: 3 mg via INTRAVENOUS

## 2015-07-22 MED ORDER — CHLORHEXIDINE GLUCONATE 4 % EX LIQD: Freq: Once | CUTANEOUS | Status: DC

## 2015-07-22 MED ORDER — KETOROLAC TROMETHAMINE 30 MG/ML IJ SOLN
INTRAMUSCULAR | Status: DC | PRN
  Administered 2015-07-22: 30 mg via INTRAVENOUS

## 2015-07-22 MED ORDER — DEXAMETHASONE SODIUM PHOSPHATE 4 MG/ML IJ SOLN
INTRAMUSCULAR | Status: DC | PRN
  Administered 2015-07-22: 8 mg via INTRAVENOUS

## 2015-07-22 MED ORDER — MIDAZOLAM HCL 2 MG/2ML IJ SOLN
INTRAMUSCULAR | Status: DC | PRN
  Administered 2015-07-22: 2 mg via INTRAVENOUS

## 2015-07-22 MED ORDER — ACETAMINOPHEN 10 MG/ML IV SOLN
INTRAVENOUS | Status: AC
  Filled 2015-07-22: qty 100

## 2015-07-22 MED ORDER — SUGAMMADEX SODIUM 500 MG/5ML IV SOLN
INTRAVENOUS | Status: DC | PRN
  Administered 2015-07-22: 200 mg via INTRAVENOUS

## 2015-07-22 MED ORDER — OXYCODONE-ACETAMINOPHEN 5-325 MG PO TABS: 1.0000 | ORAL_TABLET | ORAL | Status: DC | PRN

## 2015-07-22 MED ORDER — ONDANSETRON HCL 4 MG/2ML IJ SOLN
INTRAMUSCULAR | Status: DC | PRN
  Administered 2015-07-22: 4 mg via INTRAVENOUS

## 2015-07-22 MED ORDER — SUCCINYLCHOLINE CHLORIDE 20 MG/ML IJ SOLN
INTRAMUSCULAR | Status: DC | PRN
  Administered 2015-07-22: 120 mg via INTRAVENOUS

## 2015-07-22 MED ORDER — LIDOCAINE HCL (CARDIAC) 20 MG/ML IV SOLN
INTRAVENOUS | Status: DC | PRN
  Administered 2015-07-22: 100 mg via INTRAVENOUS

## 2015-07-22 MED ORDER — IBUPROFEN 600 MG PO TABS: 600.0000 mg | ORAL_TABLET | Freq: Four times a day (QID) | ORAL | Status: DC | PRN

## 2015-07-22 MED ORDER — FENTANYL CITRATE (PF) 100 MCG/2ML IJ SOLN
INTRAMUSCULAR | Status: AC
  Filled 2015-07-22: qty 2

## 2015-07-22 MED ORDER — ONDANSETRON HCL 4 MG/2ML IJ SOLN: 4.0000 mg | Freq: Once | INTRAMUSCULAR | Status: DC | PRN

## 2015-07-22 MED ORDER — BUPIVACAINE HCL 0.5 % IJ SOLN
INTRAMUSCULAR | Status: DC | PRN
  Administered 2015-07-22: 7 mL

## 2015-07-22 MED ORDER — OXYCODONE-ACETAMINOPHEN 5-325 MG PO TABS
ORAL_TABLET | ORAL | Status: AC
  Filled 2015-07-22: qty 1

## 2015-07-22 SURGICAL SUPPLY — 30 items
BLADE SURG SZ11 CARB STEEL (BLADE) ×3 IMPLANT
CANISTER SUCT 1200ML W/VALVE (MISCELLANEOUS) ×3 IMPLANT
CATH ROBINSON RED A/P 16FR (CATHETERS) ×3 IMPLANT
CHLORAPREP W/TINT 26ML (MISCELLANEOUS) ×3 IMPLANT
DRSG TEGADERM 2-3/8X2-3/4 SM (GAUZE/BANDAGES/DRESSINGS) ×3 IMPLANT
GAUZE SPONGE NON-WVN 2X2 STRL (MISCELLANEOUS) ×1 IMPLANT
GLOVE BIO SURGEON STRL SZ7 (GLOVE) ×3 IMPLANT
GLOVE INDICATOR 7.5 STRL GRN (GLOVE) ×3 IMPLANT
GOWN STRL REUS W/ TWL LRG LVL3 (GOWN DISPOSABLE) ×2 IMPLANT
GOWN STRL REUS W/TWL LRG LVL3 (GOWN DISPOSABLE) ×4
IRRIGATION STRYKERFLOW (MISCELLANEOUS) IMPLANT
IRRIGATOR STRYKERFLOW (MISCELLANEOUS)
IV LACTATED RINGERS 1000ML (IV SOLUTION) ×3 IMPLANT
KIT RM TURNOVER CYSTO AR (KITS) ×3 IMPLANT
LABEL OR SOLS (LABEL) ×3 IMPLANT
LIQUID BAND (GAUZE/BANDAGES/DRESSINGS) ×3 IMPLANT
NS IRRIG 500ML POUR BTL (IV SOLUTION) ×3 IMPLANT
PACK GYN LAPAROSCOPIC (MISCELLANEOUS) ×3 IMPLANT
PAD OB MATERNITY 4.3X12.25 (PERSONAL CARE ITEMS) ×3 IMPLANT
PAD PREP 24X41 OB/GYN DISP (PERSONAL CARE ITEMS) ×3 IMPLANT
SCISSORS METZENBAUM CVD 33 (INSTRUMENTS) IMPLANT
SHEARS HARMONIC ACE PLUS 36CM (ENDOMECHANICALS) ×3 IMPLANT
SLEEVE ENDOPATH XCEL 5M (ENDOMECHANICALS) ×3 IMPLANT
SPONGE VERSALON 2X2 STRL (MISCELLANEOUS) ×2
SUT MNCRL AB 4-0 PS2 18 (SUTURE) ×3 IMPLANT
SUT VIC AB 0 CT2 27 (SUTURE) ×3 IMPLANT
SYRINGE 10CC LL (SYRINGE) ×3 IMPLANT
TROCAR ENDO BLADELESS 11MM (ENDOMECHANICALS) IMPLANT
TROCAR XCEL NON-BLD 5MMX100MML (ENDOMECHANICALS) ×3 IMPLANT
TUBING INSUFFLATOR HI FLOW (MISCELLANEOUS) ×3 IMPLANT

## 2015-07-22 NOTE — H&P (Signed)
Initial paper H&P reviewed:   History reviewed, patient examined, no change in status, stable for surgery.  

## 2015-07-22 NOTE — Discharge Instructions (Signed)

## 2015-07-22 NOTE — Op Note (Signed)
Preoperative Diagnosis: 1) 29 y.o. with previously biopsy proven stage II endometriosis 2) Acute exacerbation of chronic pelvic pain  Postoperative Diagnosis: 1) 29 y.o. stage I endometriosis 2) Acute exacerbation of chronic pelvic pain  Operation Performed: Diagnostic laparoscopy  Indication: 29 y.o. G3P3000  with stage II endometriosis diagnosed on laparoscopy 8 years ago with recent worsening in chronic pelvic pain.    Anesthesia: General  Preoperative Antibiotics: none  Estimated Blood Loss: 5mL  IV Fluids: 700mL of crystaloid  Urine Output:: ~3850mL  Drains or Tubes: none  Implants: none  Specimens Removed: none  Complications: none  Intraoperative Findings: Normal tubes, ovaries, and uterus.  Evidence of prior tubal ligation with 2 hulka clips visualized across each tube.  Status post prior appendectomy.  Ovarian fosas free of endometriotic implants.  Cul de sac with some endometriosis implants involving the right uterosacral ligament.  Ureters normal path and caliber.  No evidence of inguinal or ventral hernias. Normal liver edge.  Patient Condition: stable  Procedure in Detail:  Patient was taken to the operating room where she was administered general anesthesia.  She was positioned in the dorsal lithotomy position utilizing Allen stirups, prepped and draped in the usual sterile fashion.  Prior to proceeding with procedure a time out was performed.  Attention was turned to the patient's pelvis.  A red rubber catheter was used to empty the patient's bladder.  An operative speculum was placed to allow visualization of the cervix.  The anterior lip of the cervix was grasped with a single tooth tenaculum, and a Hulka tenaculum was placed to allow manipulation of the uterus.  The operative speculum and single tooth tenaculum were then removed.  Attention was turned to the patient's abdomen.  The umbilicus was infiltrated with 1% Sensorcaine, before making a stab incision using  an 11 blade scalpel.  A 5mm Excel trocar was then used to gain direct entry into the peritoneal cavity utilizing the camera to visualize progress of the trocar during placement.  Once peritoneal entry had been achieved, insufflation was started and pneumoperitoneum established at a pressure of 15mmHg.   General inspection of the abdomen revealed the above noted findings.  A second 5mm excel trocar was placed RLQ to allow manipulation of the ovaries and more adequately visualize the ovarian fossas.  Pneumoperitoneum was evacuated.  The trocars were removed.  The 8mm trocar site was closed with 4-0 Monocryl in a subcuticular fashion.  All trocar sites were then dressed with surgical skin glue.  The Hulka tenaculum was removed.  Sponge needle and instrument counts were correct time two.  The patient tolerated the procedure well and was taken to the recovery room in stable condition.

## 2015-07-22 NOTE — Anesthesia Preprocedure Evaluation (Signed)
Anesthesia Evaluation  Patient identified by MRN, date of birth, ID band Patient awake    Reviewed: Allergy & Precautions, NPO status   Airway Mallampati: II       Dental  (+) Partial Lower   Pulmonary neg pulmonary ROS, Current Smoker,    Pulmonary exam normal        Cardiovascular negative cardio ROS Normal cardiovascular exam     Neuro/Psych  Headaches,    GI/Hepatic negative GI ROS, Neg liver ROS,   Endo/Other  negative endocrine ROS  Renal/GU negative Renal ROS  negative genitourinary   Musculoskeletal negative musculoskeletal ROS (+)   Abdominal Normal abdominal exam  (+)   Peds negative pediatric ROS (+)  Hematology negative hematology ROS (+)   Anesthesia Other Findings   Reproductive/Obstetrics negative OB ROS                             Anesthesia Physical Anesthesia Plan  ASA: II  Anesthesia Plan: General   Post-op Pain Management:    Induction: Intravenous  Airway Management Planned: Oral ETT  Additional Equipment:   Intra-op Plan:   Post-operative Plan: Extubation in OR  Informed Consent: I have reviewed the patients History and Physical, chart, labs and discussed the procedure including the risks, benefits and alternatives for the proposed anesthesia with the patient or authorized representative who has indicated his/her understanding and acceptance.     Plan Discussed with: CRNA  Anesthesia Plan Comments:         Anesthesia Quick Evaluation

## 2015-07-22 NOTE — Anesthesia Procedure Notes (Signed)
Procedure Name: Intubation Performed by: Ginger CarneMICHELET, Aman Bonet Pre-anesthesia Checklist: Patient identified, Emergency Drugs available, Suction available and Patient being monitored Patient Re-evaluated:Patient Re-evaluated prior to inductionOxygen Delivery Method: Circle system utilized Preoxygenation: Pre-oxygenation with 100% oxygen Intubation Type: IV induction Ventilation: Mask ventilation without difficulty Laryngoscope Size: Miller and 2 Grade View: Grade II Tube type: Oral Tube size: 7.0 mm Number of attempts: 1 Airway Equipment and Method: Stylet

## 2015-07-22 NOTE — Anesthesia Postprocedure Evaluation (Signed)
  Anesthesia Post-op Note  Patient: Krystal Reeves  Procedure(s) Performed: Procedure(s): LAPAROSCOPY DIAGNOSTIC (N/A)  Anesthesia type:General  Patient location: PACU  Post pain: Pain level controlled  Post assessment: Post-op Vital signs reviewed, Patient's Cardiovascular Status Stable, Respiratory Function Stable, Patent Airway and No signs of Nausea or vomiting  Post vital signs: Reviewed and stable  Last Vitals:  Filed Vitals:   07/22/15 1323  BP: 131/74  Pulse: 74  Temp: 37.3 C  Resp: 18    Level of consciousness: awake, alert  and patient cooperative  Complications: No apparent anesthesia complications

## 2015-07-22 NOTE — Transfer of Care (Signed)
Immediate Anesthesia Transfer of Care Note  Patient: Krystal Reeves  Procedure(s) Performed: Procedure(s): LAPAROSCOPY DIAGNOSTIC (N/A)  Patient Location:  2  Anesthesia Type:General  Level of Consciousness: awake, alert  and oriented  Airway & Oxygen Therapy: Patient Spontanous Breathing and Patient connected to nasal cannula oxygen  Post-op Assessment: Report given to RN and Post -op Vital signs reviewed and stable  Post vital signs: Reviewed and stable  Last Vitals:  Filed Vitals:   07/22/15 1323  BP: 131/74  Pulse: 74  Temp:   Resp: 18    Complications: No apparent anesthesia complications

## 2015-08-05 ENCOUNTER — Emergency Department
Admission: EM | Admit: 2015-08-05 | Discharge: 2015-08-05 | Disposition: A | Discharge status: Home / Self Care | Payer: Commercial Managed Care - PPO | Attending: Emergency Medicine | Admitting: Emergency Medicine

## 2015-08-05 ENCOUNTER — Emergency Department: Payer: Commercial Managed Care - PPO

## 2015-08-05 DIAGNOSIS — Z720 Tobacco use: Secondary | ICD-10-CM | POA: Diagnosis not present

## 2015-08-05 DIAGNOSIS — R0789 Other chest pain: Secondary | ICD-10-CM | POA: Diagnosis present

## 2015-08-05 DIAGNOSIS — R079 Chest pain, unspecified: Secondary | ICD-10-CM | POA: Diagnosis not present

## 2015-08-05 LAB — COMPREHENSIVE METABOLIC PANEL
ALT: 15 U/L (ref 14–54)
AST: 18 U/L (ref 15–41)
Albumin: 3.8 g/dL (ref 3.5–5.0)
Alkaline Phosphatase: 60 U/L (ref 38–126)
Anion gap: 6 (ref 5–15)
BUN: 9 mg/dL (ref 6–20)
CHLORIDE: 110 mmol/L (ref 101–111)
CO2: 24 mmol/L (ref 22–32)
CREATININE: 0.71 mg/dL (ref 0.44–1.00)
Calcium: 9.2 mg/dL (ref 8.9–10.3)
Glucose, Bld: 105 mg/dL — ABNORMAL HIGH (ref 65–99)
POTASSIUM: 4.3 mmol/L (ref 3.5–5.1)
SODIUM: 140 mmol/L (ref 135–145)
Total Bilirubin: 0.4 mg/dL (ref 0.3–1.2)
Total Protein: 7.7 g/dL (ref 6.5–8.1)

## 2015-08-05 LAB — CBC
HCT: 40.6 % (ref 35.0–47.0)
Hemoglobin: 13.2 g/dL (ref 12.0–16.0)
MCH: 28.8 pg (ref 26.0–34.0)
MCHC: 32.6 g/dL (ref 32.0–36.0)
MCV: 88.3 fL (ref 80.0–100.0)
PLATELETS: 301 10*3/uL (ref 150–440)
RBC: 4.6 MIL/uL (ref 3.80–5.20)
RDW: 14.4 % (ref 11.5–14.5)
WBC: 11.2 10*3/uL — AB (ref 3.6–11.0)

## 2015-08-05 LAB — TROPONIN I

## 2015-08-05 LAB — FIBRIN DERIVATIVES D-DIMER (ARMC ONLY): FIBRIN DERIVATIVES D-DIMER (ARMC): 308 (ref 0–499)

## 2015-08-05 MED ORDER — KETOROLAC TROMETHAMINE 10 MG PO TABS: 10.0000 mg | ORAL_TABLET | Freq: Three times a day (TID) | ORAL | Status: DC | PRN

## 2015-08-05 MED ORDER — KETOROLAC TROMETHAMINE 60 MG/2ML IM SOLN
60.0000 mg | Freq: Once | INTRAMUSCULAR | Status: AC
  Administered 2015-08-05: 60 mg via INTRAMUSCULAR

## 2015-08-05 MED ORDER — KETOROLAC TROMETHAMINE 30 MG/ML IJ SOLN
INTRAMUSCULAR | Status: AC
  Administered 2015-08-05: 16:00:00
  Filled 2015-08-05: qty 1

## 2015-08-05 NOTE — ED Provider Notes (Signed)
[facility] Emergency Department Provider Note  ____________________________________________  Time seen: Approximately 3:19 PM  I have reviewed the triage vital signs and the nursing notes.   HISTORY  Chief Complaint Chest Pain   HPI Krystal Reeves is a 29 y.o. female with a history of endometriosis presenting with left-sided chest pain.  Patient reports that yesterday she was sitting in a chair when she developed acute onset of a sharp, pleuritic chest pain "under my left breast" that did not radiate, was not associated with diaphoresis, nausea or vomiting, or palpitations. She did not feel short of breath but felt like it was difficult to take deep breaths due to pain. The episode lasted 1-2 hours and resolved on its own. She has not tried any medications to help her pain. She denies any recent leg swelling, travel. She is a smoker. Today, she had a similar episode which has been easing off but continues to be persistent. She denies any cough, cold symptoms, fever, chills.   Past Medical History  Diagnosis Date  . Endometriosis   . Headache     There are no active problems to display for this patient.   Past Surgical History  Procedure Laterality Date  . Appendectomy    . Tubal ligation Bilateral   . Diagnostic laparoscopy    . Laparoscopy N/A 07/22/2015    Procedure: LAPAROSCOPY DIAGNOSTIC;  Surgeon: Vena Austria, MD;  Location: [facility] ORS;  Service: Gynecology;  Laterality: N/A;    Current Outpatient Rx  Name  Route  Sig  Dispense  Refill  . ketorolac (TORADOL) 10 MG tablet   Oral   Take 1 tablet (10 mg total) by mouth every 8 (eight) hours as needed for moderate pain (with food).   15 tablet   0   . oxyCODONE-acetaminophen (ROXICET) 5-325 MG tablet   Oral   Take 1 tablet by mouth every 4 (four) hours as needed for severe pain.   30 tablet   0     Allergies Review of patient's allergies indicates no known allergies.  History reviewed.  No pertinent family history. no family history of DVT or PE.  Social History Social History  Substance Use Topics  . Smoking status: Current Every Day Smoker -- 0.50 packs/day for 8 years    Types: Cigarettes  . Smokeless tobacco: Never Used  . Alcohol Use: Yes     Comment: occasionally    Review of Systems Constitutional: No fever/chills. No lightheadedness or syncope. Eyes: No visual changes. ENT: No sore throat. Cardiovascular: Positive chest pain, negative palpitations. Respiratory: Denies shortness of breath.  No cough. Gastrointestinal: No abdominal pain.  No nausea, no vomiting.  No diarrhea.  No constipation. Genitourinary: Negative for dysuria. Musculoskeletal: Negative for back pain. Negative for leg swelling or calf pain. Skin: Negative for rash. Neurological: Negative for headaches, focal weakness or numbness.  10-point ROS otherwise negative.  ____________________________________________   PHYSICAL EXAM:  VITAL SIGNS: ED Triage Vitals  Enc Vitals Group     BP 08/05/15 1426 122/65 mmHg     Pulse Rate 08/05/15 1426 88     Resp 08/05/15 1426 16     Temp 08/05/15 1426 98.8 F (37.1 C)     Temp Source 08/05/15 1426 Oral     SpO2 08/05/15 1426 96 %     Weight 08/05/15 1426 200 lb (90.719 kg)     Height 08/05/15 1426  (1.676 m)     Head Cir --  Peak Flow --      Pain Score 08/05/15 1427 7     Pain Loc --      Pain Edu? --      Excl. in GC? --     Constitutional: Alert and oriented. Well appearing and mildly uncomfortable.Marland Kitchen. Answer question appropriately. Eyes: Conjunctivae are normal.  EOMI. Head: Atraumatic. Nose: No congestion/rhinnorhea. Mouth/Throat: Mucous membranes are moist.  Neck: No stridor.  Supple.  No JVD Cardiovascular: Normal rate, regular rhythm. No murmurs, rubs or gallops.  Respiratory: Normal respiratory effort.  No retractions. Lungs CTAB.  No wheezes, rales or ronchi. Gastrointestinal: Soft and nontender. No distention. No  peritoneal signs. Musculoskeletal: No LE edema. No calf pain to palpation or palpable cords. Negative Homans sign. Neurologic:  Normal speech and language. No gross focal neurologic deficits are appreciated.  Skin:  Skin is warm, dry and intact. No rash noted. Psychiatric: Mood and affect are normal. Speech and behavior are normal.  Normal judgement.  ____________________________________________   LABS (all labs ordered are listed, but only abnormal results are displayed)  Labs Reviewed  CBC - Abnormal; Notable for the following:    WBC 11.2 (*)    All other components within normal limits  COMPREHENSIVE METABOLIC PANEL - Abnormal; Notable for the following:    Glucose, Bld 105 (*)    All other components within normal limits  TROPONIN I  FIBRIN DERIVATIVES D-DIMER ([facility] ONLY)   ____________________________________________  EKG  ED ECG REPORT I, Rockne MenghiniNorman, Anne-Caroline, the attending physician, personally viewed and interpreted this ECG.   Date: 08/05/2015  EKG Time: 1424  Rate: 89  Rhythm: normal sinus rhythm  Axis: Normal  Intervals:none  ST&T Change: Nonspecific T-wave inversions in V1. No ST elevation. No ischemic changes.  ____________________________________________  RADIOLOGY  Dg Chest 2 View  08/05/2015  CLINICAL DATA:  Patient with chest pain that started yesterday on left chest that is sharp in nature. Patient reports chest pain is worse with deep breaths. Patient denies nausea or vomiting today. EXAM: CHEST  2 VIEW COMPARISON:  None. FINDINGS: The heart size and mediastinal contours are within normal limits. Both lungs are clear. The visualized skeletal structures are unremarkable. IMPRESSION: No active cardiopulmonary disease. Electronically Signed   By: Norva PavlovElizabeth  Brown M.D.   On: 08/05/2015 15:01    ____________________________________________   PROCEDURES  Procedure(s) performed: None  Critical Care performed:  No ____________________________________________   INITIAL IMPRESSION / ASSESSMENT AND PLAN / ED COURSE  Pertinent labs & imaging results that were available during my care of the patient were reviewed by me and considered in my medical decision making (see chart for details).  29 y.o. female with a history of endometriosis presenting with a pleuritic left-sided chest pain that is intermittent. Overall, the patient is well-appearing and has normal oxygenation and normal heart rate. She does not have any evidence of DVT. She has no evidence of right heart strain on her EKG. Her risk factors for blood clots include smoking, but she is otherwise not recently been on any trips and is not on oral contraceptives. I will plan to perform a d-dimer to evaluate for PE, but her risk factors and clinical presentation do not warrant immediate CT scan. The patient has a normal chest x-ray so pneumothorax is not on the differential. It is unlikely that she would have an acute MI at this age with few risk factors. I will plan to treat her pain, and reevaluate her after the results are back.  Patient  workup was reassuring. Her EKG did not show any ischemic changes, and her labs were reassuring. She had a d-dimer which was negative as well. After treatment, her pain was significantly improved. Patient was discharged in stable condition.  ____________________________________________  FINAL CLINICAL IMPRESSION(S) / ED DIAGNOSES  Final diagnoses:  Chest pain, unspecified chest pain type      NEW MEDICATIONS STARTED DURING THIS VISIT:  Discharge Medication List as of 08/05/2015  4:52 PM       Rockne Menghini, MD 08/05/15 2020

## 2015-08-05 NOTE — ED Notes (Signed)
States she had laposcopy procedure on 10/25 ..then developed some chest discomfort and SOB yesterday and then again today...pain to chest is worse on inspiration

## 2015-08-05 NOTE — Discharge Instructions (Signed)
You may take Tylenol or Toradol for your chest pain. Do not take other NSAID medications when you're taking Toradol; these include Advil, Aleve, ibuprofen, and Motrin. Next  Please return to the emergency department if you develop worsening pain, shortness of breath, lightheadedness or fainting, fever, or any other symptoms concerning to you.

## 2015-08-05 NOTE — ED Notes (Signed)
Patient with chest pain that started yesterday on left chest that is sharp in nature.  Patient reports chest pain is worse with deep breaths.  Patient denies nausea or vomiting today.  Patient reports having shortness of breath this morning, but is went away.

## 2015-08-15 ENCOUNTER — Emergency Department
Admission: EM | Admit: 2015-08-15 | Discharge: 2015-08-15 | Disposition: A | Discharge status: Home / Self Care | Payer: Commercial Managed Care - PPO | Attending: Emergency Medicine | Admitting: Emergency Medicine

## 2015-08-15 ENCOUNTER — Encounter: Payer: Self-pay | Admitting: *Deleted

## 2015-08-15 DIAGNOSIS — B373 Candidiasis of vulva and vagina: Secondary | ICD-10-CM | POA: Insufficient documentation

## 2015-08-15 DIAGNOSIS — B379 Candidiasis, unspecified: Secondary | ICD-10-CM

## 2015-08-15 DIAGNOSIS — N809 Endometriosis, unspecified: Secondary | ICD-10-CM

## 2015-08-15 DIAGNOSIS — F17210 Nicotine dependence, cigarettes, uncomplicated: Secondary | ICD-10-CM | POA: Diagnosis not present

## 2015-08-15 DIAGNOSIS — Z3202 Encounter for pregnancy test, result negative: Secondary | ICD-10-CM | POA: Insufficient documentation

## 2015-08-15 DIAGNOSIS — R102 Pelvic and perineal pain: Secondary | ICD-10-CM | POA: Diagnosis present

## 2015-08-15 LAB — WET PREP, GENITAL
CLUE CELLS WET PREP: NONE SEEN
Sperm: NONE SEEN
TRICH WET PREP: NONE SEEN
WBC, Wet Prep HPF POC: NONE SEEN

## 2015-08-15 LAB — URINALYSIS COMPLETE WITH MICROSCOPIC (ARMC ONLY)
BILIRUBIN URINE: NEGATIVE
GLUCOSE, UA: NEGATIVE mg/dL
Nitrite: NEGATIVE
Protein, ur: NEGATIVE mg/dL
Specific Gravity, Urine: 1.02 (ref 1.005–1.030)
pH: 6 (ref 5.0–8.0)

## 2015-08-15 LAB — POCT PREGNANCY, URINE: Preg Test, Ur: NEGATIVE

## 2015-08-15 LAB — CHLAMYDIA/NGC RT PCR (ARMC ONLY)
CHLAMYDIA TR: NOT DETECTED
N GONORRHOEAE: NOT DETECTED

## 2015-08-15 MED ORDER — TRAMADOL HCL 50 MG PO TABS: 50.0000 mg | ORAL_TABLET | Freq: Four times a day (QID) | ORAL | Status: DC | PRN

## 2015-08-15 MED ORDER — FLUCONAZOLE 50 MG PO TABS
150.0000 mg | ORAL_TABLET | Freq: Once | ORAL | Status: AC
  Administered 2015-08-15: 150 mg via ORAL
  Filled 2015-08-15: qty 1

## 2015-08-15 MED ORDER — KETOROLAC TROMETHAMINE 60 MG/2ML IM SOLN
60.0000 mg | Freq: Once | INTRAMUSCULAR | Status: AC
  Administered 2015-08-15: 60 mg via INTRAMUSCULAR
  Filled 2015-08-15: qty 2

## 2015-08-15 MED ORDER — OXYCODONE-ACETAMINOPHEN 5-325 MG PO TABS
1.0000 | ORAL_TABLET | Freq: Once | ORAL | Status: AC
  Administered 2015-08-15: 1 via ORAL
  Filled 2015-08-15: qty 1

## 2015-08-15 NOTE — ED Notes (Signed)
Pt reports bilateral lower pelvic pain starting last night, pt denies any other symptoms

## 2015-08-15 NOTE — ED Provider Notes (Signed)
[facility] Emergency Department Provider Note  ____________________________________________   I have reviewed the triage vital signs and the nursing notes.   HISTORY  Chief Complaint Pelvic Pain    HPI Chaniqua Brisby is a 29 y.o. female with a history of endometriosis for 7 years. She has routine pelvic pain. She has had multiple different ultrasounds this year alone. Patient has been taking gabapentin for this pain. However, she still has breakthrough pain. She is complaining of breakthrough pain. She does have a recurrent occasional vaginal discharge which is never she states been positive for STI. She states that she takes she may have a yeast infection. The patient states normally she takes naproxen for breakthrough pain but does not help. Her last missed her period was October 27, patient states that it is just before her menstrual cycle should began and this is usually when the pain is worse. This is exactly pain she's had multiple times. No fever no vomiting. The patient states that the naproxen did not help. In addition, the patient states that she started birth control pills earlier this month and they have not yet begun to help this problem. She is closely followed by OB/GYN. She has had a tubal ligation and she does not think that she has had a STI exposure  Past Medical History  Diagnosis Date  . Endometriosis   . Headache     There are no active problems to display for this patient.   Past Surgical History  Procedure Laterality Date  . Appendectomy    . Tubal ligation Bilateral   . Diagnostic laparoscopy    . Laparoscopy N/A 07/22/2015    Procedure: LAPAROSCOPY DIAGNOSTIC;  Surgeon: Vena Austria, MD;  Location: [facility] ORS;  Service: Gynecology;  Laterality: N/A;    Current Outpatient Rx  Name  Route  Sig  Dispense  Refill  . ketorolac (TORADOL) 10 MG tablet   Oral   Take 1 tablet (10 mg total) by mouth every 8 (eight) hours as needed for  moderate pain (with food).   15 tablet   0   . oxyCODONE-acetaminophen (ROXICET) 5-325 MG tablet   Oral   Take 1 tablet by mouth every 4 (four) hours as needed for severe pain.   30 tablet   0     Allergies Review of patient's allergies indicates no known allergies.  No family history on file.  Social History Social History  Substance Use Topics  . Smoking status: Current Every Day Smoker -- 0.50 packs/day for 8 years    Types: Cigarettes  . Smokeless tobacco: Never Used  . Alcohol Use: Yes     Comment: occasionally    Review of Systems Constitutional: No fever/chills Eyes: No visual changes. ENT: No sore throat. No stiff neck no neck pain Cardiovascular: Denies chest pain. Respiratory: Denies shortness of breath. Gastrointestinal:   no vomiting.  No diarrhea.  No constipation. Genitourinary: Negative for dysuria. Musculoskeletal: Negative lower extremity swelling Skin: Negative for rash. Neurological: Negative for headaches, focal weakness or numbness. 10-point ROS otherwise negative.  ____________________________________________   PHYSICAL EXAM:  VITAL SIGNS: ED Triage Vitals  Enc Vitals Group     BP 08/15/15 0823 128/78 mmHg     Pulse Rate 08/15/15 0823 85     Resp 08/15/15 0823 20     Temp 08/15/15 0823 98.4 F (36.9 C)     Temp Source 08/15/15 0823 Oral     SpO2 08/15/15 0823 97 %  Weight 08/15/15 0823 202 lb (91.627 kg)     Height 08/15/15 0823 5\' 6"  (1.676 m)     Head Cir --      Peak Flow --      Pain Score 08/15/15 0823 8     Pain Loc --      Pain Edu? --      Excl. in GC? --     Constitutional: Alert and oriented. Well appearing and in no acute distress. Using a video phone chat Eyes: Conjunctivae are normal. PERRL. EOMI. Head: Atraumatic. Nose: No congestion/rhinnorhea. Mouth/Throat: Mucous membranes are moist.  Oropharynx non-erythematous. Neck: No stridor.   Nontender with no meningismus Cardiovascular: Normal rate, regular  rhythm. Grossly normal heart sounds.  Good peripheral circulation. Respiratory: Normal respiratory effort.  No retractions. Lungs CTAB. Abdominal: Soft and nontender. No distention. No guarding no rebound Back:  There is no focal tenderness or step off there is no midline tenderness there are no lesions noted. there is no CVA tenderness Pelvic exam: Female nurse chaperone present, no external lesions noted, physiologic vaginal discharge noted with no purulent discharge, no cervical motion tenderness, no adnexal tenderness or mass, there is no significant uterine tenderness or mass. No vaginal bleeding Musculoskeletal: No lower extremity tenderness. No joint effusions, no DVT signs strong distal pulses no edema Neurologic:  Normal speech and language. No gross focal neurologic deficits are appreciated.  Skin:  Skin is warm, dry and intact. No rash noted. Psychiatric: Mood and affect are normal. Speech and behavior are normal.  ____________________________________________   LABS (all labs ordered are listed, but only abnormal results are displayed)  Labs Reviewed  URINALYSIS COMPLETEWITH MICROSCOPIC ([facility] ONLY) - Abnormal; Notable for the following:    Color, Urine YELLOW (*)    APPearance HAZY (*)    Ketones, ur TRACE (*)    Hgb urine dipstick 1+ (*)    Leukocytes, UA 1+ (*)    Bacteria, UA RARE (*)    Squamous Epithelial / LPF 0-5 (*)    All other components within normal limits  CHLAMYDIA/NGC RT PCR ([facility] ONLY)  WET PREP, GENITAL  POC URINE PREG, ED  POCT PREGNANCY, URINE   ____________________________________________  EKG  I personally interpreted any EKGs ordered by me or triage  ____________________________________________  RADIOLOGY  I reviewed any imaging ordered by me or triage that were performed during my shift ____________________________________________   PROCEDURES  Procedure(s) performed: None  Critical Care performed:  None  ____________________________________________   INITIAL IMPRESSION / ASSESSMENT AND PLAN / ED COURSE  Pertinent labs & imaging results that were available during my care of the patient were reviewed by me and considered in my medical decision making (see chart for details).  Patient with chronic current pelvic pain presents with recurrent pelvic pain. This time she is pain-free. Initially, she was on the telephone, now she is watching a movie with her friend in the room on her phone. Patient is very well-appearing. We will discharge her as soon as I get the results from her wet prep. Low suspicion for PID , she is not pregnant therefore I do not believe she has an ectopic pregnancy, there is nothing to suggest this time  gallbladder disease, she is status post appendectomy remotely, however return precautions and follow-up stressed and understood. ____________________________________________   FINAL CLINICAL IMPRESSION(S) / ED DIAGNOSES  Final diagnoses:  None     Jeanmarie PlantJames A Amily Depp, MD 08/15/15 1032

## 2015-08-15 NOTE — ED Notes (Signed)
Patient states that she has a hx/o endometriosis, was given RX for Gabapentin for to help with the pain, Patient started medication on 07/30/15, meds have not been helping with the pain. Patient also c/o vaginal swelling and [place], odorless discharge.

## 2015-08-15 NOTE — Discharge Instructions (Signed)
Endometriosis Endometriosis is a condition in which the tissue that lines the uterus (endometrium) grows outside of its normal location. The tissue may grow in many locations close to the uterus, but it commonly grows on the ovaries, fallopian tubes, vagina, or bowel. Because the uterus expels, or sheds, its lining every menstrual cycle, there is bleeding wherever the endometrial tissue is located. This can cause pain because blood is irritating to tissues not normally exposed to it.  CAUSES  The cause of endometriosis is not known.  SIGNS AND SYMPTOMS  Often, there are no symptoms. When symptoms are present, they can vary with the location of the displaced tissue. Various symptoms can occur at different times. Although symptoms occur mainly during a woman's menstrual period, they can also occur midcycle and usually stop with menopause. Some people may go months with no symptoms at all. Symptoms may include:   Back or abdominal pain.   Heavier bleeding during periods.   Pain during intercourse.   Painful bowel movements.   Infertility. DIAGNOSIS  Your health care provider will do a physical exam and ask about your symptoms. Various tests may be done, such as:   Blood tests and urine tests. These are done to help rule out other problems.   Ultrasound. This test is done to look for abnormal tissue.   An X-ray of the lower bowel (barium enema).  Laparoscopy. In this procedure, a thin, lighted tube with a tiny camera on the end (laparoscope) is inserted into your abdomen. This helps your health care provider look for abnormal tissue to confirm the diagnosis. The health care provider may also remove a small piece of tissue (biopsy) from any abnormal tissue found. This tissue sample can then be sent to a lab so it can be looked at under a microscope. TREATMENT  Treatment will vary and may include:   Medicines to relieve pain. Nonsteroidal anti-inflammatory drugs (NSAIDs) are a type of  pain medicine that can help to relieve the pain caused by endometriosis.  Hormonal therapy. When using hormonal therapy, periods are eliminated. This eliminates the monthly exposure to blood by the displaced endometrial tissue.   Surgery. Surgery may sometimes be done to remove the abnormal endometrial tissue. In severe cases, surgery may be done to remove the fallopian tubes, uterus, and ovaries (hysterectomy). HOME CARE INSTRUCTIONS   Take all medicines as directed by your health care provider. Do not take aspirin because it may increase bleeding when you are not on hormonal therapy.   Avoid activities that produce pain, including sexual activity. SEEK MEDICAL CARE IF:  You have pelvic pain before, after, or during your periods.  You have pelvic pain between periods that gets worse during your period.  You have pelvic pain during or after sex.  You have pelvic pain with bowel movements or urination, especially during your period.  You have problems getting pregnant.  You have a fever. SEEK IMMEDIATE MEDICAL CARE IF:   Your pain is severe and is not responding to pain medicine.   You have severe nausea and vomiting, or you cannot keep foods down.   You have pain that is limited to the right lower part of your abdomen.   You have swelling or increasing pain in your abdomen.   You see blood in your stool.  MAKE SURE YOU:   Understand these instructions.  Will watch your condition.  Will get help right away if you are not doing well or get worse.   This information   is not intended to replace advice given to you by your health care provider. Make sure you discuss any questions you have with your health care provider.   Document Released: 09/10/2000 Document Revised: 10/04/2014 Document Reviewed: 05/11/2013 Elsevier Interactive Patient Education 2016 [employer]  

## 2015-08-18 ENCOUNTER — Ambulatory Visit
Admission: EM | Admit: 2015-08-18 | Discharge: 2015-08-18 | Disposition: A | Discharge status: Home / Self Care | Payer: Commercial Managed Care - PPO | Attending: Family Medicine | Admitting: Family Medicine

## 2015-08-18 ENCOUNTER — Encounter: Payer: Self-pay | Admitting: Emergency Medicine

## 2015-08-18 DIAGNOSIS — R059 Cough, unspecified: Secondary | ICD-10-CM

## 2015-08-18 DIAGNOSIS — R05 Cough: Secondary | ICD-10-CM | POA: Diagnosis not present

## 2015-08-18 DIAGNOSIS — J069 Acute upper respiratory infection, unspecified: Secondary | ICD-10-CM

## 2015-08-18 DIAGNOSIS — H6503 Acute serous otitis media, bilateral: Secondary | ICD-10-CM | POA: Diagnosis not present

## 2015-08-18 DIAGNOSIS — J029 Acute pharyngitis, unspecified: Secondary | ICD-10-CM

## 2015-08-18 LAB — RAPID STREP SCREEN (MED CTR MEBANE ONLY): STREPTOCOCCUS, GROUP A SCREEN (DIRECT): NEGATIVE

## 2015-08-18 MED ORDER — AMOXICILLIN-POT CLAVULANATE 875-125 MG PO TABS: 1.0000 | ORAL_TABLET | Freq: Two times a day (BID) | ORAL | Status: DC

## 2015-08-18 MED ORDER — FLUTICASONE PROPIONATE 50 MCG/ACT NA SUSP: 2.0000 | Freq: Every day | NASAL | Status: DC

## 2015-08-18 MED ORDER — FEXOFENADINE-PSEUDOEPHED ER 180-240 MG PO TB24: 1.0000 | ORAL_TABLET | Freq: Every day | ORAL | Status: DC

## 2015-08-18 MED ORDER — HYDROCOD POLST-CPM POLST ER 10-8 MG/5ML PO SUER: 5.0000 mL | Freq: Two times a day (BID) | ORAL | Status: DC | PRN

## 2015-08-18 NOTE — ED Notes (Signed)
Patient states she has had a cough for the past week, her ears hurt and is losing her voice

## 2015-08-18 NOTE — Discharge Instructions (Signed)
Otitis Media With Effusion Otitis media with effusion is the presence of fluid in the middle ear. This is a common problem in children, which often follows ear infections. It may be present for weeks or longer after the infection. Unlike an acute ear infection, otitis media with effusion refers only to fluid behind the ear drum and not infection. Children with repeated ear and sinus infections and allergy problems are the most likely to get otitis media with effusion. CAUSES  The most frequent cause of the fluid buildup is dysfunction of the eustachian tubes. These are the tubes that drain fluid in the ears to the back of the nose (nasopharynx). SYMPTOMS   The main symptom of this condition is hearing loss. As a result, you or your child may:  Listen to the TV at a loud volume.  Not respond to questions.  Ask "what" often when spoken to.  Mistake or confuse one sound or word for another.  There may be a sensation of fullness or pressure but usually not pain. DIAGNOSIS   Your health care provider will diagnose this condition by examining you or your child's ears.  Your health care provider may test the pressure in you or your child's ear with a tympanometer.  A hearing test may be conducted if the problem persists. TREATMENT   Treatment depends on the duration and the effects of the effusion.  Antibiotics, decongestants, nose drops, and cortisone-type drugs (tablets or nasal spray) may not be helpful.  Children with persistent ear effusions may have delayed language or behavioral problems. Children at risk for developmental delays in hearing, learning, and speech may require referral to a specialist earlier than children not at risk.  You or your child's health care provider may suggest a referral to an ear, nose, and throat surgeon for treatment. The following may help restore normal hearing:  Drainage of fluid.  Placement of ear tubes (tympanostomy tubes).  Removal of adenoids  (adenoidectomy). HOME CARE INSTRUCTIONS   Avoid secondhand smoke.  Infants who are breastfed are less likely to have this condition.  Avoid feeding infants while they are lying flat.  Avoid known environmental allergens.  Avoid people who are sick. SEEK MEDICAL CARE IF:   Hearing is not better in 3 months.  Hearing is worse.  Ear pain.  Drainage from the ear.  Dizziness. MAKE SURE YOU:   Understand these instructions.  Will watch your condition.  Will get help right away if you are not doing well or get worse.   This information is not intended to replace advice given to you by your health care provider. Make sure you discuss any questions you have with your health care provider.   Document Released: 10/21/2004 Document Revised: 10/04/2014 Document Reviewed: 04/10/2013 Elsevier Interactive Patient Education 2016 Else[employer] Inc[employer]  Pharyngitis Pharyngitis is a sore throat (pharynx). There is redness, pain, and swelling of your throat. HOME CARE   Drink enough fluids to keep your pee (urine) clear or pale yellow.  Only take medicine as told by your doctor.  You may get sick again if you do not take medicine as told. Finish your medicines, even if you start to feel better.  Do not take aspirin.  Rest.  Rinse your mouth (gargle) with salt water ( tsp of salt per 1 qt of water) every 1-2 hours. This will help the pain.  If you are not at risk for choking, you can suck on hard candy or sore throat lozenges. GET HELP IF:  You have large, tender lumps on your neck.  You have a rash.  You cough up green, yellow-brown, or bloody spit. GET HELP RIGHT AWAY IF:   You have a stiff neck.  You drool or cannot swallow liquids.  You throw up (vomit) or are not able to keep medicine or liquids down.  You have very bad pain that does not go away with medicine.  You have problems breathing (not from a stuffy nose). MAKE SURE YOU:   Understand these  instructions.  Will watch your condition.  Will get help right away if you are not doing well or get worse.   This information is not intended to replace advice given to you by your health care provider. Make sure you discuss any questions you have with your health care provider.   Document Released: 03/01/2008 Document Revised: 07/04/2013 Document Reviewed: 05/21/2013 Elsevier Interactive Patient Education 2016 [employer]vier Inc.  Upper Respiratory Infection, Adult Most upper respiratory infections (URIs) are caused by a virus. A URI affects the nose, throat, and upper air passages. The most common type of URI is often called "the common cold." HOME CARE   Take medicines only as told by your doctor.  Gargle warm saltwater or take cough drops to comfort your throat as told by your doctor.  Use a warm mist humidifier or inhale steam from a shower to increase air moisture. This may make it easier to breathe.  Drink enough fluid to keep your pee (urine) clear or pale yellow.  Eat soups and other clear broths.  Have a healthy diet.  Rest as needed.  Go back to work when your fever is gone or your doctor says it is okay.  You may need to stay home longer to avoid giving your URI to others.  You can also wear a face mask and wash your hands often to prevent spread of the virus.  Use your inhaler more if you have asthma.  Do not use any tobacco products, including cigarettes, chewing tobacco, or electronic cigarettes. If you need help quitting, ask your doctor. GET HELP IF:  You are getting worse, not better.  Your symptoms are not helped by medicine.  You have chills.  You are getting more short of breath.  You have brown or red mucus.  You have yellow or brown discharge from your nose.  You have pain in your face, especially when you bend forward.  You have a fever.  You have puffy (swollen) neck glands.  You have pain while swallowing.  You have Syler areas in the  back of your throat. GET HELP RIGHT AWAY IF:   You have very bad or constant:  Headache.  Ear pain.  Pain in your forehead, behind your eyes, and over your cheekbones (sinus pain).  Chest pain.  You have long-lasting (chronic) lung disease and any of the following:  Wheezing.  Long-lasting cough.  Coughing up blood.  A change in your usual mucus.  You have a stiff neck.  You have changes in your:  Vision.  Hearing.  Thinking.  Mood. MAKE SURE YOU:   Understand these instructions.  Will watch your condition.  Will get help right away if you are not doing well or get worse.   This information is not intended to replace advice given to you by your health care provider. Make sure you discuss any questions you have with your health care provider.   Document Released: 03/01/2008 Document Revised: 01/28/2015 Document Reviewed: 12/19/2013 Elsevier  Interactive Patient Education ©2016 Elsevier Inc. ° °

## 2015-08-18 NOTE — ED Provider Notes (Signed)
CSN: [id number]     Arrival date & time 08/18/15  1611 History   First MD Initiated Contact with Patient 08/18/15 1708    Nurses notes were reviewed. Chief Complaint  Patient presents with  . Cough     He reports being sick now for about a week nasal congestion coughing and coughs worse at night and during the day keeping up at night she also states that the sore right ear hurts worse than left. She denies any fever but reports the cough is nonproductive basically at this time as well. I think started about a week ago with nasal congestion and the sore throat. She is not smoking now but she is a smoker.   (Consider location/radiation/quality/duration/timing/severity/associated sxs/prior Treatment) Patient is a 29 y.o. female presenting with cough. The history is provided by the patient and a friend. No language interpreter was used.  Cough Cough characteristics:  Non-productive and hoarse Severity:  Moderate Duration:  7 days Timing:  Constant Progression:  Worsening Context: smoke exposure and upper respiratory infection   Context: not weather changes   Relieved by:  Nothing Ineffective treatments:  None tried Associated symptoms: chills, ear pain, rhinorrhea and sore throat   Associated symptoms: no shortness of breath and no wheezing   Associated symptoms comment:  Right ear is worse in the left   Past Medical History  Diagnosis Date  . Endometriosis   . Headache    Past Surgical History  Procedure Laterality Date  . Appendectomy    . Tubal ligation Bilateral   . Diagnostic laparoscopy    . Laparoscopy N/A 07/22/2015    Procedure: LAPAROSCOPY DIAGNOSTIC;  Surgeon: Vena AustriaAndreas Staebler, MD;  Location: [facility] ORS;  Service: Gynecology;  Laterality: N/A;   History reviewed. No pertinent family history. Social History  Substance Use Topics  . Smoking status: Current Every Day Smoker -- 0.50 packs/day for 8 years    Types: Cigarettes  . Smokeless tobacco: Never Used  .  Alcohol Use: Yes     Comment: occasionally   OB History    Gravida Para Term Preterm AB TAB SAB Ectopic Multiple Living   3 3 3             Review of Systems  Constitutional: Positive for chills.  HENT: Positive for ear pain, rhinorrhea and sore throat.   Respiratory: Positive for cough. Negative for shortness of breath and wheezing.   All other systems reviewed and are negative.   Allergies  Review of patient's allergies indicates no known allergies.  Home Medications   Prior to Admission medications   Medication Sig Start Date End Date Taking? Authorizing Provider  amoxicillin-clavulanate (AUGMENTIN) 875-125 MG tablet Take 1 tablet by mouth 2 (two) times daily. 08/18/15   Hassan RowanEugene Cade Olberding, MD  chlorpheniramine-HYDROcodone [facility](TUSSIONEX PENNKINETIC ER) 10-8 MG/5ML SUER Take 5 mLs by mouth every 12 (twelve) hours as needed for cough. 08/18/15   Hassan RowanEugene Kishon Garriga, MD  fexofenadine-pseudoephedrine (ALLEGRA-D ALLERGY & CONGESTION) 180-240 MG 24 hr tablet Take 1 tablet by mouth daily. 08/18/15   Hassan RowanEugene Felisia Balcom, MD  fluticasone (FLONASE) 50 MCG/ACT nasal spray Place 2 sprays into both nostrils daily. 08/18/15   Hassan RowanEugene Kengo Sturges, MD  gabapentin (NEURONTIN) 300 MG capsule Take 300 mg by mouth 3 (three) times daily.    Historical Provider, MD  ketorolac (TORADOL) 10 MG tablet Take 1 tablet (10 mg total) by mouth every 8 (eight) hours as needed for moderate pain (with food). Patient not taking: Reported on 08/15/2015 08/05/15   Anne-Caroline  Sharma Covert, MD  naproxen (NAPROSYN) 250 MG tablet Take 250 mg by mouth as needed.    Historical Provider, MD  norethindrone (AYGESTIN) 5 MG tablet Take 5 mg by mouth daily.    Historical Provider, MD  oxyCODONE-acetaminophen (ROXICET) 5-325 MG tablet Take 1 tablet by mouth every 4 (four) hours as needed for severe pain. 07/22/15   Vena Austria, MD  traMADol (ULTRAM) 50 MG tablet Take 1 tablet (50 mg total) by mouth every 6 (six) hours as needed. 08/15/15   Jeanmarie Plant, MD    Meds Ordered and Administered this Visit  Medications - No data to display  BP 107/77 mmHg  Pulse 83  Temp(Src) 98.6 F (37 C) (Tympanic)  Resp 18  Ht  (1.676 m)  Wt 208 lb (94.348 kg)  BMI 33.59 kg/m2  SpO2 99%  LMP 07/24/2015 No data found.   Physical Exam  Constitutional: She is oriented to person, place, and time. She appears well-developed and well-nourished. No distress.  HENT:  Head: Normocephalic and atraumatic.  Right Ear: Tympanic membrane is injected and bulging.  Left Ear: Tympanic membrane is bulging.  Nose: Mucosal edema and rhinorrhea present. Right sinus exhibits no maxillary sinus tenderness and no frontal sinus tenderness. Left sinus exhibits no maxillary sinus tenderness and no frontal sinus tenderness.  Mouth/Throat: Posterior oropharyngeal erythema present.  Eyes: Conjunctivae are normal. Pupils are equal, round, and reactive to light.  Neck: Normal range of motion. Neck supple. Carotid bruit is not present. No tracheal deviation and normal range of motion present.  Cardiovascular: Normal rate, regular rhythm and normal heart sounds.   Pulmonary/Chest: Effort normal and breath sounds normal.  Musculoskeletal: Normal range of motion.  Lymphadenopathy:    She has cervical adenopathy.  Neurological: She is alert and oriented to person, place, and time. No cranial nerve deficit.  Skin: Skin is warm and dry.  Vitals reviewed.   ED Course  Procedures (including critical care time)  Labs Review Labs Reviewed  RAPID STREP SCREEN (NOT AT [facility])    Imaging Review No results found.   Visual Acuity Review  Right Eye Distance:   Left Eye Distance:   Bilateral Distance:    Right Eye Near:   Left Eye Near:    Bilateral Near:      Results for orders placed or performed during the hospital encounter of 08/18/15  Rapid strep screen  Result Value Ref Range   Streptococcus, Group A Screen (Direct) NEGATIVE NEGATIVE    MDM   1. URI, acute    2. Pharyngitis   3. Cough   4. Bilateral acute serous otitis media, recurrence not specified      We'll place her on Augmentin 875 testing 1 teaspoon twice a day and Allegra-D 1 tablet daily. Follow-up with PCP if not better also work note for today and tomorrow. Allegra-D and  follow-up here or with PCP in about a week if not better.     Hassan Rowan, MD 08/18/15 2056

## 2015-08-20 LAB — CULTURE, GROUP A STREP (THRC)

## 2015-09-01 ENCOUNTER — Ambulatory Visit
Admission: EM | Admit: 2015-09-01 | Discharge: 2015-09-01 | Disposition: A | Discharge status: Home / Self Care | Payer: Commercial Managed Care - PPO | Attending: Family Medicine | Admitting: Family Medicine

## 2015-09-01 ENCOUNTER — Encounter: Payer: Self-pay | Admitting: Emergency Medicine

## 2015-09-01 DIAGNOSIS — J020 Streptococcal pharyngitis: Secondary | ICD-10-CM

## 2015-09-01 LAB — RAPID STREP SCREEN (MED CTR MEBANE ONLY): Streptococcus, Group A Screen (Direct): POSITIVE — AB

## 2015-09-01 MED ORDER — PENICILLIN G BENZATHINE 1200000 UNIT/2ML IM SUSP
1.2000 10*6.[IU] | Freq: Once | INTRAMUSCULAR | Status: AC
  Administered 2015-09-01: 1.2 10*6.[IU] via INTRAMUSCULAR

## 2015-09-01 NOTE — ED Provider Notes (Signed)
CSN: [id number]     Arrival date & time 09/01/15  [id number] History   First MD Initiated Contact with Patient 09/01/15 [phone]     Nurses notes were reviewed. Chief Complaint  Patient presents with  . Sore Throat    Patient states she has sore throat started yesterday night. She recently got over a URI.   Nurses notes were reviewed. (Consider location/radiation/quality/duration/timing/severity/associated sxs/prior Treatment) Patient is a 29 y.o. female presenting with pharyngitis. The history is provided by the patient. No language interpreter was used.  Sore Throat This is a new problem. The current episode started yesterday. The problem occurs constantly. The problem has been gradually worsening. Pertinent negatives include no chest pain and no abdominal pain. Nothing aggravates the symptoms. Nothing relieves the symptoms. She has tried nothing for the symptoms. The treatment provided no relief.    Past Medical History  Diagnosis Date  . Endometriosis   . Headache    Past Surgical History  Procedure Laterality Date  . Appendectomy    . Tubal ligation Bilateral   . Diagnostic laparoscopy    . Laparoscopy N/A 07/22/2015    Procedure: LAPAROSCOPY DIAGNOSTIC;  Surgeon: Vena AustriaAndreas Staebler, MD;  Location: [facility] ORS;  Service: Gynecology;  Laterality: N/A;   No family history on file. Social History  Substance Use Topics  . Smoking status: Former Smoker -- 0.50 packs/day for 8 years    Types: Cigarettes  . Smokeless tobacco: Never Used  . Alcohol Use: Yes     Comment: occasionally   OB History    Gravida Para Term Preterm AB TAB SAB Ectopic Multiple Living   3 3 3             Review of Systems  Cardiovascular: Negative for chest pain.  Gastrointestinal: Negative for abdominal pain.  All other systems reviewed and are negative.   Allergies  Review of patient's allergies indicates no known allergies.  Home Medications   Prior to Admission medications   Medication Sig Start Date  End Date Taking? Authorizing Provider  amoxicillin-clavulanate (AUGMENTIN) 875-125 MG tablet Take 1 tablet by mouth 2 (two) times daily. 08/18/15   Hassan RowanEugene Iker Nuttall, MD  chlorpheniramine-HYDROcodone [facility](TUSSIONEX PENNKINETIC ER) 10-8 MG/5ML SUER Take 5 mLs by mouth every 12 (twelve) hours as needed for cough. 08/18/15   Hassan RowanEugene Jathen Sudano, MD  fexofenadine-pseudoephedrine (ALLEGRA-D ALLERGY & CONGESTION) 180-240 MG 24 hr tablet Take 1 tablet by mouth daily. 08/18/15   Hassan RowanEugene Valita Righter, MD  fluticasone (FLONASE) 50 MCG/ACT nasal spray Place 2 sprays into both nostrils daily. 08/18/15   Hassan RowanEugene Evalena Fujii, MD  gabapentin (NEURONTIN) 300 MG capsule Take 300 mg by mouth 3 (three) times daily.    Historical Provider, MD  ketorolac (TORADOL) 10 MG tablet Take 1 tablet (10 mg total) by mouth every 8 (eight) hours as needed for moderate pain (with food). Patient not taking: Reported on 08/15/2015 08/05/15   Rockne MenghiniAnne-Caroline Norman, MD  naproxen (NAPROSYN) 250 MG tablet Take 250 mg by mouth as needed.    Historical Provider, MD  norethindrone (AYGESTIN) 5 MG tablet Take 5 mg by mouth daily.    Historical Provider, MD  oxyCODONE-acetaminophen (ROXICET) 5-325 MG tablet Take 1 tablet by mouth every 4 (four) hours as needed for severe pain. 07/22/15   Vena AustriaAndreas Staebler, MD  traMADol (ULTRAM) 50 MG tablet Take 1 tablet (50 mg total) by mouth every 6 (six) hours as needed. 08/15/15   Jeanmarie PlantJames A McShane, MD   Meds Ordered and Administered this Visit   Medications  penicillin g benzathine (BICILLIN LA) 1200000 UNIT/2ML injection 1.2 Million Units (1.2 Million Units Intramuscular Given 09/01/15 0927)    BP 129/77 mmHg  Pulse 75  Temp(Src) 97.3 F (36.3 C) (Tympanic)  Resp 16  Ht  (1.676 m)  Wt 200 lb (90.719 kg)  BMI 32.30 kg/m2  SpO2 98%  LMP 07/24/2015 No data found.   Physical Exam  Constitutional: She is oriented to person, place, and time. She appears well-developed and well-nourished.  HENT:  Head: Normocephalic and  atraumatic.  Right Ear: External ear normal.  Left Ear: External ear normal.  Nose: Mucosal edema and rhinorrhea present.  Mouth/Throat: Uvula is midline. Oropharyngeal exudate, posterior oropharyngeal edema and posterior oropharyngeal erythema present.  Eyes: Conjunctivae and EOM are normal. Pupils are equal, round, and reactive to light.  Neck: Normal range of motion. Neck supple. No tracheal deviation present. No thyromegaly present.  Musculoskeletal: Normal range of motion. She exhibits no edema.  Neurological: She is alert and oriented to person, place, and time.  Skin: Skin is warm.  Psychiatric: She has a normal mood and affect.  Vitals reviewed.   ED Course  Procedures (including critical care time)  Labs Review Labs Reviewed  RAPID STREP SCREEN (NOT AT [facility]) - Abnormal; Notable for the following:    Streptococcus, Group A Screen (Direct) POSITIVE (*)    All other components within normal limits    Imaging Review No results found.   Visual Acuity Review  Right Eye Distance:   Left Eye Distance:   Bilateral Distance:    Right Eye Near:   Left Eye Near:    Bilateral Near:     Strep test is positive.    MDM   1. Streptococcal pharyngitis     Will Mr. Toula Moos Bicillin 1.2 milliunits patient's agreement recommend 2 weeks follow-up for proof of cure. Work note for today and tomorrow.     Hassan Rowan, MD 09/01/15 [phone]

## 2015-09-01 NOTE — ED Notes (Signed)
Pt reports sore throat that started yesterday associated with fever

## 2015-09-01 NOTE — Discharge Instructions (Signed)
Pharyngitis °Pharyngitis is a sore throat (pharynx). There is redness, pain, and swelling of your throat. °HOME CARE  °· Drink enough fluids to keep your pee (urine) clear or pale yellow. °· Only take medicine as told by your doctor. °· You may get sick again if you do not take medicine as told. Finish your medicines, even if you start to feel better. °· Do not take aspirin. °· Rest. °· Rinse your mouth (gargle) with salt water (½ tsp of salt per 1 qt of water) every 1-2 hours. This will help the pain. °· If you are not at risk for choking, you can suck on hard candy or sore throat lozenges. °GET HELP IF: °· You have large, tender lumps on your neck. °· You have a rash. °· You cough up green, yellow-brown, or bloody spit. °GET HELP RIGHT AWAY IF:  °· You have a stiff neck. °· You drool or cannot swallow liquids. °· You throw up (vomit) or are not able to keep medicine or liquids down. °· You have very bad pain that does not go away with medicine. °· You have problems breathing (not from a stuffy nose). °MAKE SURE YOU:  °· Understand these instructions. °· Will watch your condition. °· Will get help right away if you are not doing well or get worse. °  °This information is not intended to replace advice given to you by your health care provider. Make sure you discuss any questions you have with your health care provider. °  °Document Released: 03/01/2008 Document Revised: 07/04/2013 Document Reviewed: 05/21/2013 °Elsevier Interactive Patient Education ©2016 [employer] ° °Strep Throat °Strep throat is an infection of the throat. It is caused by germs. Strep throat spreads from person to person because of coughing, sneezing, or close contact. °HOME CARE °Medicines  °· Take over-the-counter and prescription medicines only as told by your doctor. °· Take your antibiotic medicine as told by your doctor. Do not stop taking the medicine even if you feel better. °· Have family members who also have a sore throat or fever  go to a doctor. °Eating and Drinking  °· Do not share food, drinking cups, or personal items. °· Try eating soft foods until your sore throat feels better. °· Drink enough fluid to keep your pee (urine) clear or pale yellow. °General Instructions °· Rinse your mouth (gargle) with a salt-water mixture 3-4 times per day or as needed. To make a salt-water mixture, stir ½-1 tsp of salt into 1 cup of warm water. °· Make sure that all people in your house wash their hands well. °· Rest. °· Stay home from school or work until you have been taking antibiotics for 24 hours. °· Keep all follow-up visits as told by your doctor. This is important. °GET HELP IF: °· Your neck keeps getting bigger. °· You get a rash, cough, or earache. °· You cough up thick liquid that is green, yellow-brown, or bloody. °· You have pain that does not get better with medicine. °· Your problems get worse instead of getting better. °· You have a fever. °GET HELP RIGHT AWAY IF: °· You throw up (vomit). °· You get a very bad headache. °· You neck hurts or it feels stiff. °· You have chest pain or you are short of breath. °· You have drooling, very bad throat pain, or changes in your voice. °· Your neck is swollen or the skin gets red and tender. °· Your mouth is dry or you are peeing less than   normal. °· You keep feeling more tired or it is hard to wake up. °· Your joints are red or they hurt. °  °This information is not intended to replace advice given to you by your health care provider. Make sure you discuss any questions you have with your health care provider. °  °Document Released: 03/01/2008 Document Revised: 06/04/2015 Document Reviewed: 01/06/2015 °Elsevier Interactive Patient Education ©2016 [employer] ° °

## 2015-09-16 ENCOUNTER — Emergency Department
Admission: EM | Admit: 2015-09-16 | Discharge: 2015-09-16 | Disposition: A | Discharge status: Home / Self Care | Payer: Commercial Managed Care - PPO | Attending: Emergency Medicine | Admitting: Emergency Medicine

## 2015-09-16 ENCOUNTER — Encounter: Payer: Self-pay | Admitting: *Deleted

## 2015-09-16 DIAGNOSIS — Z79899 Other long term (current) drug therapy: Secondary | ICD-10-CM | POA: Diagnosis not present

## 2015-09-16 DIAGNOSIS — Y9289 Other specified places as the place of occurrence of the external cause: Secondary | ICD-10-CM | POA: Insufficient documentation

## 2015-09-16 DIAGNOSIS — Z87891 Personal history of nicotine dependence: Secondary | ICD-10-CM | POA: Insufficient documentation

## 2015-09-16 DIAGNOSIS — Y9389 Activity, other specified: Secondary | ICD-10-CM | POA: Diagnosis not present

## 2015-09-16 DIAGNOSIS — T7840XA Allergy, unspecified, initial encounter: Secondary | ICD-10-CM

## 2015-09-16 DIAGNOSIS — Y998 Other external cause status: Secondary | ICD-10-CM | POA: Insufficient documentation

## 2015-09-16 DIAGNOSIS — R21 Rash and other nonspecific skin eruption: Secondary | ICD-10-CM | POA: Diagnosis present

## 2015-09-16 DIAGNOSIS — X58XXXA Exposure to other specified factors, initial encounter: Secondary | ICD-10-CM | POA: Insufficient documentation

## 2015-09-16 MED ORDER — RANITIDINE HCL 150 MG PO TABS: 150.0000 mg | ORAL_TABLET | Freq: Two times a day (BID) | ORAL | Status: DC

## 2015-09-16 MED ORDER — DIPHENHYDRAMINE HCL 50 MG/ML IJ SOLN
50.0000 mg | Freq: Once | INTRAMUSCULAR | Status: AC
  Administered 2015-09-16: 50 mg via INTRAMUSCULAR
  Filled 2015-09-16: qty 1

## 2015-09-16 MED ORDER — DEXAMETHASONE SODIUM PHOSPHATE 10 MG/ML IJ SOLN
10.0000 mg | Freq: Once | INTRAMUSCULAR | Status: AC
  Administered 2015-09-16: 10 mg via INTRAMUSCULAR
  Filled 2015-09-16: qty 1

## 2015-09-16 MED ORDER — HYDROXYZINE PAMOATE 25 MG PO CAPS: 25.0000 mg | ORAL_CAPSULE | Freq: Three times a day (TID) | ORAL | Status: DC | PRN

## 2015-09-16 MED ORDER — FAMOTIDINE 20 MG PO TABS
20.0000 mg | ORAL_TABLET | Freq: Once | ORAL | Status: AC
  Administered 2015-09-16: 20 mg via ORAL
  Filled 2015-09-16: qty 1

## 2015-09-16 NOTE — ED Notes (Signed)
Patient states she first noticed a rash around her eyes last night and took two Benadryl. Patient states that today rash has spread throughout face and down to chest. Patient denies trouble breathing. Patient states rash is "itchy."

## 2015-09-16 NOTE — ED Provider Notes (Signed)
[facility] Emergency Department Provider Note  ____________________________________________  Time seen: Approximately 4:15 PM  I have reviewed the triage vital signs and the nursing notes.   HISTORY  Chief Complaint Rash    HPI Krystal Reeves is a 29 y.o. female presents for evaluation of rash around her eyes and neck. Starting to spread down the face is chest and arms. Patient states that the rash started last p.m. and has progressively got worse today. Took 2 over-the-counter Benadryl this morning with no relief.   Past Medical History  Diagnosis Date  . Endometriosis   . Headache     There are no active problems to display for this patient.   Past Surgical History  Procedure Laterality Date  . Appendectomy    . Tubal ligation Bilateral   . Diagnostic laparoscopy    . Laparoscopy N/A 07/22/2015    Procedure: LAPAROSCOPY DIAGNOSTIC;  Surgeon: Vena AustriaAndreas Staebler, MD;  Location: [facility] ORS;  Service: Gynecology;  Laterality: N/A;    Current Outpatient Rx  Name  Route  Sig  Dispense  Refill  . fluticasone (FLONASE) 50 MCG/ACT nasal spray   Each Nare   Place 2 sprays into both nostrils daily.   16 g   2   . gabapentin (NEURONTIN) 300 MG capsule   Oral   Take 300 mg by mouth 3 (three) times daily.         . hydrOXYzine (VISTARIL) 25 MG capsule   Oral   Take 1 capsule (25 mg total) by mouth 3 (three) times daily as needed.   30 capsule   0   . naproxen (NAPROSYN) 250 MG tablet   Oral   Take 250 mg by mouth as needed.         . norethindrone (AYGESTIN) 5 MG tablet   Oral   Take 5 mg by mouth daily.         . ranitidine (ZANTAC) 150 MG tablet   Oral   Take 1 tablet (150 mg total) by mouth 2 (two) times daily.   14 tablet   1     Allergies Review of patient's allergies indicates no known allergies.  No family history on file.  Social History Social History  Substance Use Topics  . Smoking status: Former Smoker -- 0.50  packs/day for 8 years    Types: Cigarettes  . Smokeless tobacco: Never Used  . Alcohol Use: Yes     Comment: occasionally    Review of Systems Constitutional: No fever/chills Eyes: No visual changes. ENT: No sore throat. Cardiovascular: Denies chest pain. Respiratory: Denies shortness of breath. Gastrointestinal: No abdominal pain.  No nausea, no vomiting.  No diarrhea.  No constipation. Genitourinary: Negative for dysuria. Musculoskeletal: Negative for back pain. Skin: Positive for rash Neurological: Negative for headaches, focal weakness or numbness.  10-point ROS otherwise negative.  ____________________________________________   PHYSICAL EXAM:  VITAL SIGNS: ED Triage Vitals  Enc Vitals Group     BP 09/16/15 1606 116/79 mmHg     Pulse Rate 09/16/15 1606 102     Resp 09/16/15 1606 18     Temp 09/16/15 1606 98.6 F (37 C)     Temp Source 09/16/15 1606 Oral     SpO2 09/16/15 1606 99 %     Weight 09/16/15 1606 200 lb (90.719 kg)     Height 09/16/15 1606 5\' 6"  (1.676 m)     Head Cir --      Peak Flow --  Pain Score --      Pain Loc --      Pain Edu? --      Excl. in GC? --     Constitutional: Alert and oriented. Well appearing and in no acute distress. Eyes: Conjunctivae are normal. PERRL. EOMI. Head: Atraumatic. Nose: No congestion/rhinnorhea. Mouth/Throat: Mucous membranes are moist.  Oropharynx non-erythematous. Neck: No stridor.  Cardiovascular: Normal rate, regular rhythm. Grossly normal heart sounds.  Good peripheral circulation. Respiratory: Normal respiratory effort.  No retractions. Lungs CTAB. Gastrointestinal: Soft and nontender. No distention. No abdominal bruits. No CVA tenderness. Musculoskeletal: No lower extremity tenderness nor edema.  No joint effusions. Neurologic:  Normal speech and language. No gross focal neurologic deficits are appreciated. No gait instability. Skin:  Skin is warm, dry and intact. Pruritic skin rash with some hives and  welts noted to the neck and arms. No macular papular lesions no pustules or vesicles. Psychiatric: Mood and affect are normal. Speech and behavior are normal.  ____________________________________________   LABS (all labs ordered are listed, but only abnormal results are displayed)  Labs Reviewed - No data to display ____________________________________________     PROCEDURES  Procedure(s) performed: None  Critical Care performed: No  ____________________________________________   INITIAL IMPRESSION / ASSESSMENT AND PLAN / ED COURSE  Pertinent labs & imaging results that were available during my care of the patient were reviewed by me and considered in my medical decision making (see chart for details).  Acute allergic reaction with urticaria. Benadryl 50 mg IM, Pepcid 20 mg by mouth and Decadron 10 mg IM given. Patient discharged with a prescription of hydroxyzine and Zantac. Follow up with PCP or return to the ER with any worsening symptomology. ____________________________________________   FINAL CLINICAL IMPRESSION(S) / ED DIAGNOSES  Final diagnoses:  Allergic reaction, initial encounter      Evangeline Dakin, PA-C 09/16/15 1709  Phineas Semen, MD 09/16/15 [phone]

## 2015-09-16 NOTE — Discharge Instructions (Signed)

## 2015-10-14 ENCOUNTER — Encounter: Payer: Self-pay | Admitting: Emergency Medicine

## 2015-10-14 ENCOUNTER — Emergency Department
Admission: EM | Admit: 2015-10-14 | Discharge: 2015-10-14 | Disposition: A | Discharge status: Home / Self Care | Payer: Commercial Managed Care - PPO | Attending: Emergency Medicine | Admitting: Emergency Medicine

## 2015-10-14 DIAGNOSIS — J029 Acute pharyngitis, unspecified: Secondary | ICD-10-CM | POA: Insufficient documentation

## 2015-10-14 DIAGNOSIS — Z79899 Other long term (current) drug therapy: Secondary | ICD-10-CM | POA: Insufficient documentation

## 2015-10-14 DIAGNOSIS — Z87891 Personal history of nicotine dependence: Secondary | ICD-10-CM | POA: Diagnosis not present

## 2015-10-14 DIAGNOSIS — Z7951 Long term (current) use of inhaled steroids: Secondary | ICD-10-CM | POA: Insufficient documentation

## 2015-10-14 MED ORDER — AMOXICILLIN 500 MG PO CAPS: 500.0000 mg | ORAL_CAPSULE | Freq: Three times a day (TID) | ORAL | Status: AC

## 2015-10-14 NOTE — ED Provider Notes (Signed)
CSN: [id number]     Arrival date & time 10/14/15  0730 History   None    Chief Complaint  Patient presents with  . Sore Throat     HPI Comments: 30 year old female presents today complaining of sore throat for the past 3 days. She has a history of recurrent strep with similar symptoms. She does not have a primary care provider at present. Has been taking motrin for fevers. No difficulty swallowing or breathing. Mild nasal congestion associated with symptoms.   The history is provided by the patient.    Past Medical History  Diagnosis Date  . Endometriosis   . Headache    Past Surgical History  Procedure Laterality Date  . Appendectomy    . Tubal ligation Bilateral   . Diagnostic laparoscopy    . Laparoscopy N/A 07/22/2015    Procedure: LAPAROSCOPY DIAGNOSTIC;  Surgeon: Vena Austria, MD;  Location: [facility] ORS;  Service: Gynecology;  Laterality: N/A;   No family history on file. Social History  Substance Use Topics  . Smoking status: Former Smoker -- 0.50 packs/day for 8 years    Types: Cigarettes  . Smokeless tobacco: Never Used  . Alcohol Use: Yes     Comment: occasionally   OB History    Gravida Para Term Preterm AB TAB SAB Ectopic Multiple Living   Review of Systems  Constitutional: Negative for fever and chills.  HENT: Positive for congestion, rhinorrhea and sore throat. Negative for trouble swallowing.   Respiratory: Negative for cough.   Musculoskeletal: Negative for myalgias and arthralgias.  Skin: Negative for rash.  All other systems reviewed and are negative.     Allergies  Review of patient's allergies indicates no known allergies.  Home Medications   Prior to Admission medications   Medication Sig Start Date End Date Taking? Authorizing Provider  amoxicillin (AMOXIL) 500 MG capsule Take 1 capsule (500 mg total) by mouth 3 (three) times daily. 10/14/15 10/24/15  Suan Halter V, PA-C  fluticasone (FLONASE) 50 MCG/ACT nasal spray  Place 2 sprays into both nostrils daily. 08/18/15   Hassan Rowan, MD  gabapentin (NEURONTIN) 300 MG capsule Take 300 mg by mouth 3 (three) times daily.    Historical Provider, MD  hydrOXYzine (VISTARIL) 25 MG capsule Take 1 capsule (25 mg total) by mouth 3 (three) times daily as needed. 09/16/15   Charmayne Sheer Beers, PA-C  naproxen (NAPROSYN) 250 MG tablet Take 250 mg by mouth as needed.    Historical Provider, MD  norethindrone (AYGESTIN) 5 MG tablet Take 5 mg by mouth daily.    Historical Provider, MD  ranitidine (ZANTAC) 150 MG tablet Take 1 tablet (150 mg total) by mouth 2 (two) times daily. 09/16/15   Charmayne Sheer Beers, PA-C   BP 132/86 mmHg  Pulse 91  Temp(Src) 98.1 F (36.7 C) (Oral)  Resp 18  Ht  (1.676 m)  Wt 94.348 kg  BMI 33.59 kg/m2  SpO2 96%  LMP 09/19/2015 (Approximate) Physical Exam  Constitutional: She is oriented to person, place, and time. Vital signs are normal. She appears well-developed and well-nourished.  Non-toxic appearance. She does not have a sickly appearance. She does not appear ill.  HENT:  Head: Normocephalic and atraumatic.  Right Ear: Tympanic membrane and ear canal normal.  Left Ear: Tympanic membrane, external ear and ear canal normal.  Nose: Nose normal.  Mouth/Throat: Uvula is midline and mucous membranes  are normal. Oropharyngeal exudate, posterior oropharyngeal edema and posterior oropharyngeal erythema present.  Eyes: Conjunctivae and EOM are normal. Pupils are equal, round, and reactive to light.  Neck: Normal range of motion. Neck supple.  Cardiovascular: Normal rate, regular rhythm and intact distal pulses.  Exam reveals no gallop and no friction rub.   No murmur heard. Pulmonary/Chest: Effort normal and breath sounds normal. No respiratory distress. She has no wheezes. She has no rales.  Musculoskeletal: Normal range of motion.  Lymphadenopathy:    She has cervical adenopathy.  Neurological: She is alert and oriented to person, place, and  time.  Skin: Skin is warm and dry. No rash noted.  Psychiatric: She has a normal mood and affect. Her behavior is normal. Judgment and thought content normal.  Nursing note and vitals reviewed.   ED Course  Procedures (including critical care time) Labs Review Labs Reviewed - No data to display  Imaging Review No results found. I have personally reviewed and evaluated these images and lab results as part of my medical decision-making.   EKG Interpretation None      MDM  Pt has recurrent history of strep, CENTOR criteria 4 will treat based on symptoms and clinical presentation. Amox  TID x 10 days. Chloraseptic spray, hot tea and alternate motrin/tylenol for fevers. Return to ER for new or worsening symptoms  Final diagnoses:  Pharyngitis        Krystal Reeves 10/14/15 0758  Myrna Blazer, MD 10/14/15 [phone]

## 2015-10-14 NOTE — Discharge Instructions (Signed)

## 2015-10-14 NOTE — ED Notes (Signed)
Developed sore throat on Sunday   Hx of recent strep

## 2015-10-20 ENCOUNTER — Encounter: Payer: Self-pay | Admitting: Emergency Medicine

## 2015-10-20 ENCOUNTER — Emergency Department
Admission: EM | Admit: 2015-10-20 | Discharge: 2015-10-20 | Disposition: A | Discharge status: Home / Self Care | Payer: Commercial Managed Care - PPO | Attending: Emergency Medicine | Admitting: Emergency Medicine

## 2015-10-20 DIAGNOSIS — R1030 Lower abdominal pain, unspecified: Secondary | ICD-10-CM | POA: Diagnosis present

## 2015-10-20 DIAGNOSIS — R102 Pelvic and perineal pain: Secondary | ICD-10-CM | POA: Insufficient documentation

## 2015-10-20 DIAGNOSIS — Z87891 Personal history of nicotine dependence: Secondary | ICD-10-CM | POA: Diagnosis not present

## 2015-10-20 DIAGNOSIS — G8929 Other chronic pain: Secondary | ICD-10-CM | POA: Insufficient documentation

## 2015-10-20 DIAGNOSIS — Z3202 Encounter for pregnancy test, result negative: Secondary | ICD-10-CM | POA: Diagnosis not present

## 2015-10-20 LAB — CBC WITH DIFFERENTIAL/PLATELET
BASOS ABS: 0.1 10*3/uL (ref 0–0.1)
Basophils Relative: 1 %
EOS PCT: 0 %
Eosinophils Absolute: 0 10*3/uL (ref 0–0.7)
HEMATOCRIT: 40.6 % (ref 35.0–47.0)
HEMOGLOBIN: 13.7 g/dL (ref 12.0–16.0)
LYMPHS ABS: 2.4 10*3/uL (ref 1.0–3.6)
LYMPHS PCT: 28 %
MCH: 28.7 pg (ref 26.0–34.0)
MCHC: 33.6 g/dL (ref 32.0–36.0)
MCV: 85.3 fL (ref 80.0–100.0)
Monocytes Absolute: 0.5 10*3/uL (ref 0.2–0.9)
Monocytes Relative: 6 %
NEUTROS ABS: 5.7 10*3/uL (ref 1.4–6.5)
Neutrophils Relative %: 65 %
PLATELETS: 278 10*3/uL (ref 150–440)
RBC: 4.76 MIL/uL (ref 3.80–5.20)
RDW: 14 % (ref 11.5–14.5)
WBC: 8.6 10*3/uL (ref 3.6–11.0)

## 2015-10-20 LAB — URINALYSIS COMPLETE WITH MICROSCOPIC (ARMC ONLY)
Bacteria, UA: NONE SEEN
Bilirubin Urine: NEGATIVE
Glucose, UA: NEGATIVE mg/dL
Hgb urine dipstick: NEGATIVE
KETONES UR: NEGATIVE mg/dL
Leukocytes, UA: NEGATIVE
Nitrite: NEGATIVE
PH: 6 (ref 5.0–8.0)
PROTEIN: NEGATIVE mg/dL
Specific Gravity, Urine: 1.015 (ref 1.005–1.030)

## 2015-10-20 LAB — COMPREHENSIVE METABOLIC PANEL
ALBUMIN: 3.6 g/dL (ref 3.5–5.0)
ALT: 27 U/L (ref 14–54)
AST: 19 U/L (ref 15–41)
Alkaline Phosphatase: 55 U/L (ref 38–126)
Anion gap: 5 (ref 5–15)
BILIRUBIN TOTAL: 0.5 mg/dL (ref 0.3–1.2)
BUN: 10 mg/dL (ref 6–20)
CHLORIDE: 109 mmol/L (ref 101–111)
CO2: 22 mmol/L (ref 22–32)
CREATININE: 0.69 mg/dL (ref 0.44–1.00)
Calcium: 8.7 mg/dL — ABNORMAL LOW (ref 8.9–10.3)
GFR calc Af Amer: >60 mL/min (ref >60)
GLUCOSE: 103 mg/dL — AB (ref 65–99)
POTASSIUM: 3.8 mmol/L (ref 3.5–5.1)
Sodium: 136 mmol/L (ref 135–145)
Total Protein: 7.6 g/dL (ref 6.5–8.1)

## 2015-10-20 LAB — POCT PREGNANCY, URINE: PREG TEST UR: NEGATIVE

## 2015-10-20 MED ORDER — HYDROMORPHONE HCL 1 MG/ML IJ SOLN
1.0000 mg | Freq: Once | INTRAMUSCULAR | Status: AC
  Administered 2015-10-20: 1 mg via INTRAMUSCULAR
  Filled 2015-10-20: qty 1

## 2015-10-20 MED ORDER — TRAMADOL HCL 50 MG PO TABS: 50.0000 mg | ORAL_TABLET | Freq: Four times a day (QID) | ORAL | Status: DC | PRN

## 2015-10-20 MED ORDER — OXYCODONE-ACETAMINOPHEN 5-325 MG PO TABS
2.0000 | ORAL_TABLET | Freq: Once | ORAL | Status: AC
  Administered 2015-10-20: 2 via ORAL
  Filled 2015-10-20: qty 2

## 2015-10-20 NOTE — Discharge Instructions (Signed)

## 2015-10-20 NOTE — ED Notes (Signed)
Pt here with lower abd pain, thinks it is related to her endometriosis.

## 2015-10-20 NOTE — ED Provider Notes (Signed)
[facility] Emergency Department Provider Note     Time seen: ----------------------------------------- 7:53 AM on 10/20/2015 -----------------------------------------    I have reviewed the triage vital signs and the nursing notes.   HISTORY  Chief Complaint Abdominal Pain    HPI Krystal Reeves is a 30 y.o. female who presents to ER with lower abdominal pain. Patient states pain goes all the way across her lower abdomen, states she's had this pain for the most part for the last 7 years. She's been diagnosed with endometriosis, states medications that Dr. Bonney Aid has prescribed are not helping.She denies fevers chills or other complaints, was here recently for sore throat but this has resolved.   Past Medical History  Diagnosis Date  . Endometriosis   . Headache     There are no active problems to display for this patient.   Past Surgical History  Procedure Laterality Date  . Appendectomy    . Tubal ligation Bilateral   . Diagnostic laparoscopy    . Laparoscopy N/A 07/22/2015    Procedure: LAPAROSCOPY DIAGNOSTIC;  Surgeon: Vena Austria, MD;  Location: [facility] ORS;  Service: Gynecology;  Laterality: N/A;    Allergies Review of patient's allergies indicates no known allergies.  Social History Social History  Substance Use Topics  . Smoking status: Former Smoker -- 0.50 packs/day for 8 years    Types: Cigarettes  . Smokeless tobacco: Never Used  . Alcohol Use: Yes     Comment: occasionally    Review of Systems Constitutional: Negative for fever. Eyes: Negative for visual changes. ENT: Negative for sore throat. Cardiovascular: Negative for chest pain. Respiratory: Negative for shortness of breath. Gastrointestinal: Positive for abdominal pain Genitourinary: Negative for dysuria. Musculoskeletal: Negative for back pain. Skin: Negative for rash. Neurological: Negative for headaches, focal weakness or numbness.  10-point ROS  otherwise negative.  ____________________________________________   PHYSICAL EXAM:  VITAL SIGNS: ED Triage Vitals  Enc Vitals Group     BP 10/20/15 0741 134/81 mmHg     Pulse Rate 10/20/15 0741 91     Resp 10/20/15 0741 18     Temp 10/20/15 0741 98.2 F (36.8 C)     Temp Source 10/20/15 0741 Oral     SpO2 10/20/15 0741 95 %     Weight 10/20/15 0741 208 lb (94.348 kg)     Height 10/20/15 0741  (1.676 m)     Head Cir --      Peak Flow --      Pain Score 10/20/15 0745 8     Pain Loc --      Pain Edu? --      Excl. in GC? --     Constitutional: Alert and oriented. Well appearing and in no distress. Eyes: Conjunctivae are normal. PERRL. Normal extraocular movements. ENT   Head: Normocephalic and atraumatic.   Nose: No congestion/rhinnorhea.   Mouth/Throat: Mucous membranes are moist.   Neck: No stridor. Cardiovascular: Normal rate, regular rhythm. Normal and symmetric distal pulses are present in all extremities. No murmurs, rubs, or gallops. Respiratory: Normal respiratory effort without tachypnea nor retractions. Breath sounds are clear and equal bilaterally. No wheezes/rales/rhonchi. Gastrointestinal: Suprapubic tenderness, no rebound or guarding. Normal bowel sounds. Musculoskeletal: Nontender with normal range of motion in all extremities. No joint effusions.  No lower extremity tenderness nor edema. Neurologic:  Normal speech and language. No gross focal neurologic deficits are appreciated. Speech is normal. No gait instability. Skin:  Skin is warm, dry and intact. No rash  noted. Psychiatric: Mood and affect are normal. Speech and behavior are normal. Patient exhibits appropriate insight and judgment.  ____________________________________________  ED COURSE:  Pertinent labs & imaging results that were available during my care of the patient were reviewed by me and considered in my medical decision making (see chart for details). Patient is in no acute  distress, has acute on chronic pain. ____________________________________________    LABS (pertinent positives/negatives)  Labs Reviewed  COMPREHENSIVE METABOLIC PANEL - Abnormal; Notable for the following:    Glucose, Bld 103 (*)    Calcium 8.7 (*)    All other components within normal limits  URINALYSIS COMPLETEWITH MICROSCOPIC ([facility] ONLY) - Abnormal; Notable for the following:    Color, Urine YELLOW (*)    APPearance CLEAR (*)    Squamous Epithelial / LPF 0-5 (*)    All other components within normal limits  CHLAMYDIA/NGC RT PCR ([facility] ONLY)  CBC WITH DIFFERENTIAL/PLATELET  POC URINE PREG, ED  POCT PREGNANCY, URINE   ____________________________________________  FINAL ASSESSMENT AND PLAN  Chronic pelvic pain  Plan: Patient with labs and imaging as dictated above. I do not see any acute findings based on her lab work or exam. Patient has chronic pelvic pain and will likely be referred to a chronic pelvic clinic. Advise her to follow-up with Dr. Bonney Aid for reevaluation. She has received pain control here.   Emily Filbert, MD    Emily Filbert, MD 10/20/15 1011

## 2015-10-20 NOTE — ED Notes (Signed)
Pt is up for discharge - she is now on a med hold  Charge informed

## 2015-11-22 ENCOUNTER — Emergency Department
Admission: EM | Admit: 2015-11-22 | Discharge: 2015-11-23 | Disposition: A | Discharge status: Home / Self Care | Payer: Commercial Managed Care - PPO | Attending: Emergency Medicine | Admitting: Emergency Medicine

## 2015-11-22 DIAGNOSIS — Z87891 Personal history of nicotine dependence: Secondary | ICD-10-CM | POA: Diagnosis not present

## 2015-11-22 DIAGNOSIS — R109 Unspecified abdominal pain: Secondary | ICD-10-CM | POA: Diagnosis present

## 2015-11-22 DIAGNOSIS — Z7951 Long term (current) use of inhaled steroids: Secondary | ICD-10-CM | POA: Diagnosis not present

## 2015-11-22 DIAGNOSIS — G8929 Other chronic pain: Secondary | ICD-10-CM | POA: Diagnosis not present

## 2015-11-22 DIAGNOSIS — Z79899 Other long term (current) drug therapy: Secondary | ICD-10-CM | POA: Insufficient documentation

## 2015-11-22 DIAGNOSIS — F419 Anxiety disorder, unspecified: Secondary | ICD-10-CM | POA: Diagnosis not present

## 2015-11-22 DIAGNOSIS — N390 Urinary tract infection, site not specified: Secondary | ICD-10-CM | POA: Diagnosis not present

## 2015-11-23 ENCOUNTER — Encounter: Payer: Self-pay | Admitting: Emergency Medicine

## 2015-11-23 LAB — COMPREHENSIVE METABOLIC PANEL
ALT: 32 U/L (ref 14–54)
AST: 20 U/L (ref 15–41)
Albumin: 3.8 g/dL (ref 3.5–5.0)
Alkaline Phosphatase: 57 U/L (ref 38–126)
Anion gap: 12 (ref 5–15)
BILIRUBIN TOTAL: 0.5 mg/dL (ref 0.3–1.2)
BUN: 12 mg/dL (ref 6–20)
CHLORIDE: 108 mmol/L (ref 101–111)
CO2: 18 mmol/L — ABNORMAL LOW (ref 22–32)
CREATININE: 0.64 mg/dL (ref 0.44–1.00)
Calcium: 8.8 mg/dL — ABNORMAL LOW (ref 8.9–10.3)
GFR calc Af Amer: >60 mL/min (ref >60)
Glucose, Bld: 96 mg/dL (ref 65–99)
Potassium: 3.7 mmol/L (ref 3.5–5.1)
Sodium: 138 mmol/L (ref 135–145)
TOTAL PROTEIN: 7.9 g/dL (ref 6.5–8.1)

## 2015-11-23 LAB — URINALYSIS COMPLETE WITH MICROSCOPIC (ARMC ONLY)
BILIRUBIN URINE: NEGATIVE
GLUCOSE, UA: NEGATIVE mg/dL
Ketones, ur: NEGATIVE mg/dL
Nitrite: NEGATIVE
PH: 5 (ref 5.0–8.0)
Protein, ur: NEGATIVE mg/dL
Specific Gravity, Urine: 1.009 (ref 1.005–1.030)

## 2015-11-23 LAB — CBC
HCT: 41.3 % (ref 35.0–47.0)
Hemoglobin: 13.8 g/dL (ref 12.0–16.0)
MCH: 28.8 pg (ref 26.0–34.0)
MCHC: 33.4 g/dL (ref 32.0–36.0)
MCV: 86 fL (ref 80.0–100.0)
PLATELETS: 247 10*3/uL (ref 150–440)
RBC: 4.8 MIL/uL (ref 3.80–5.20)
RDW: 14.2 % (ref 11.5–14.5)
WBC: 10.6 10*3/uL (ref 3.6–11.0)

## 2015-11-23 LAB — LIPASE, BLOOD: Lipase: 25 U/L (ref 11–51)

## 2015-11-23 LAB — POCT PREGNANCY, URINE: Preg Test, Ur: NEGATIVE

## 2015-11-23 MED ORDER — NITROFURANTOIN MONOHYD MACRO 100 MG PO CAPS: 100.0000 mg | ORAL_CAPSULE | Freq: Two times a day (BID) | ORAL | Status: DC

## 2015-11-23 MED ORDER — CEPHALEXIN 500 MG PO CAPS: 500.0000 mg | ORAL_CAPSULE | Freq: Four times a day (QID) | ORAL | Status: DC

## 2015-11-23 MED ORDER — HYDROMORPHONE HCL 1 MG/ML IJ SOLN
1.0000 mg | Freq: Once | INTRAMUSCULAR | Status: AC
  Administered 2015-11-23: 1 mg via INTRAVENOUS
  Filled 2015-11-23: qty 1

## 2015-11-23 MED ORDER — CEPHALEXIN 500 MG PO CAPS
500.0000 mg | ORAL_CAPSULE | Freq: Once | ORAL | Status: AC
  Administered 2015-11-23: 500 mg via ORAL
  Filled 2015-11-23: qty 1

## 2015-11-23 NOTE — Discharge Instructions (Signed)

## 2015-11-23 NOTE — ED Provider Notes (Addendum)
[facility] Emergency Department Provider Note  ____________________________________________   I have reviewed the triage vital signs and the nursing notes.   HISTORY  Chief Complaint Abdominal Pain    HPI Krystal Reeves is a 30 y.o. female with every day abdominal pain presents today with her chronic pelvic pain. She is status post appendectomy remotely. She has a history of endometriosis and has pain every day. Some days are worse than others. She has had pain this time. She has had no fever or chills no vomiting or diarrhea. She states that this is the same aching pain she usually has.  Past Medical History  Diagnosis Date  . Endometriosis   . Headache     There are no active problems to display for this patient.   Past Surgical History  Procedure Laterality Date  . Appendectomy    . Tubal ligation Bilateral   . Diagnostic laparoscopy    . Laparoscopy N/A 07/22/2015    Procedure: LAPAROSCOPY DIAGNOSTIC;  Surgeon: Vena Austria, MD;  Location: [facility] ORS;  Service: Gynecology;  Laterality: N/A;    Current Outpatient Rx  Name  Route  Sig  Dispense  Refill  . fluticasone (FLONASE) 50 MCG/ACT nasal spray   Each Nare   Place 2 sprays into both nostrils daily.   16 g   2   . gabapentin (NEURONTIN) 300 MG capsule   Oral   Take 300 mg by mouth 3 (three) times daily.         . hydrOXYzine (VISTARIL) 25 MG capsule   Oral   Take 1 capsule (25 mg total) by mouth 3 (three) times daily as needed.   30 capsule   0   . naproxen (NAPROSYN) 250 MG tablet   Oral   Take 250 mg by mouth as needed.         . norethindrone (AYGESTIN) 5 MG tablet   Oral   Take 5 mg by mouth daily.         . ranitidine (ZANTAC) 150 MG tablet   Oral   Take 1 tablet (150 mg total) by mouth 2 (two) times daily. Patient not taking: Reported on 10/20/2015   14 tablet   1   . traMADol (ULTRAM) 50 MG tablet   Oral   Take 1 tablet (50 mg total) by mouth every 6  (six) hours as needed.   20 tablet   0     Allergies Review of patient's allergies indicates no known allergies.  History reviewed. No pertinent family history.  Social History Social History  Substance Use Topics  . Smoking status: Former Smoker -- 0.50 packs/day for 8 years    Types: Cigarettes  . Smokeless tobacco: Never Used  . Alcohol Use: Yes     Comment: occasionally    Review of Systems Constitutional: No fever/chills Eyes: No visual changes. ENT: No sore throat. No stiff neck no neck pain Cardiovascular: Denies chest pain. Respiratory: Denies shortness of breath. Gastrointestinal:   no vomiting.  No diarrhea.  No constipation. Genitourinary: Negative for dysuria. Musculoskeletal: Negative lower extremity swelling Skin: Negative for rash. Neurological: Negative for headaches, focal weakness or numbness. 10-point ROS otherwise negative.  ____________________________________________   PHYSICAL EXAM:  VITAL SIGNS: ED Triage Vitals  Enc Vitals Group     BP 11/23/15 0003 125/90 mmHg     Pulse Rate 11/23/15 0003 81     Resp 11/23/15 0003 16     Temp 11/23/15 0003 98.2 F (36.8 C)  Temp Source 11/23/15 0003 Oral     SpO2 11/23/15 0003 96 %     Weight 11/23/15 0003 214 lb (97.07 kg)     Height 11/23/15 0003  (1.676 m)     Head Cir --      Peak Flow --      Pain Score 11/23/15 0004 9     Pain Loc --      Pain Edu? --      Excl. in GC? --     Constitutional: Alert and oriented. Well appearing and in no acute distress. Anxious and upset but nontoxic Eyes: Conjunctivae are normal. PERRL. EOMI. Head: Atraumatic. Nose: No congestion/rhinnorhea. Mouth/Throat: Mucous membranes are moist.  Oropharynx non-erythematous. Neck: No stridor.   Nontender with no meningismus Cardiovascular: Normal rate, regular rhythm. Grossly normal heart sounds.  Good peripheral circulation. Respiratory: Normal respiratory effort.  No retractions. Lungs CTAB. Abdominal:  There is mild lower abdominal discomfort which is quite distractible guarding or rebound Back:  There is no focal tenderness or step off there is no midline tenderness there are no lesions noted. there is no CVA tenderness Musculoskeletal: No lower extremity tenderness. No joint effusions, no DVT signs strong distal pulses no edema Neurologic:  Normal speech and language. No gross focal neurologic deficits are appreciated.  Skin:  Skin is warm, dry and intact. No rash noted. Psychiatric: Mood and affect are normal. Speech and behavior are normal.  ____________________________________________   LABS (all labs ordered are listed, but only abnormal results are displayed)  Labs Reviewed  COMPREHENSIVE METABOLIC PANEL - Abnormal; Notable for the following:    CO2 18 (*)    Calcium 8.8 (*)    All other components within normal limits  LIPASE, BLOOD  CBC  URINALYSIS COMPLETEWITH MICROSCOPIC ([facility] ONLY)  POC URINE PREG, ED   ____________________________________________  EKG  I personally interpreted any EKGs ordered by me or triage Sinus rhythm rate 74 bpm no acute ST elevation or acute ST depression normal axis unremarkable EKG ____________________________________________  RADIOLOGY  I reviewed any imaging ordered by me or triage that were performed during my shift ____________________________________________   PROCEDURES  Procedure(s) performed: None  Critical Care performed: None  ____________________________________________   INITIAL IMPRESSION / ASSESSMENT AND PLAN / ED COURSE  Pertinent labs & imaging results that were available during my care of the patient were reviewed by me and considered in my medical decision making (see chart for details).  Patient with chronic abdominal presenting with her chronic abdominal pain. Nonsurgical abdomen. We will give her pain medications and reassess.  ----------------------------------------- 3:16 AM on  11/23/2015 -----------------------------------------  Serial abdominal exams are benign patient states she is pain-free right now tolerating by mouth no distress, no abdominal discomfort or tenderness. She may have a small urinary tract infection will treat her, urine culture is pending. ____________________________________________   FINAL CLINICAL IMPRESSION(S) / ED DIAGNOSES  Final diagnoses:  None      This chart was dictated using voice recognition software.  Despite best efforts to proofread,  errors can occur which can change meaning.     Jeanmarie Plant, MD 11/23/15 7829  Jeanmarie Plant, MD 11/23/15 5621  Jeanmarie Plant, MD 11/23/15 [phone]

## 2015-11-23 NOTE — ED Notes (Signed)
Patient presents to Emergency Department via EMS with complaints of right side abdominal pain.  Hx of tube ligation, appendectomy and endometriosis.

## 2015-11-24 LAB — URINE CULTURE

## 2016-06-14 ENCOUNTER — Encounter (HOSPITAL_COMMUNITY): Payer: Self-pay | Admitting: Vascular Surgery

## 2016-06-14 DIAGNOSIS — R1011 Right upper quadrant pain: Secondary | ICD-10-CM | POA: Diagnosis present

## 2016-06-14 DIAGNOSIS — Z87891 Personal history of nicotine dependence: Secondary | ICD-10-CM | POA: Insufficient documentation

## 2016-06-14 LAB — COMPREHENSIVE METABOLIC PANEL
ALK PHOS: 76 U/L (ref 38–126)
ALT: 19 U/L (ref 14–54)
ANION GAP: 6 (ref 5–15)
AST: 15 U/L (ref 15–41)
Albumin: 3.5 g/dL (ref 3.5–5.0)
BUN: 10 mg/dL (ref 6–20)
CALCIUM: 9 mg/dL (ref 8.9–10.3)
CO2: 23 mmol/L (ref 22–32)
Chloride: 108 mmol/L (ref 101–111)
Creatinine, Ser: 0.85 mg/dL (ref 0.44–1.00)
Glucose, Bld: 96 mg/dL (ref 65–99)
Potassium: 3.7 mmol/L (ref 3.5–5.1)
SODIUM: 137 mmol/L (ref 135–145)
Total Bilirubin: 0.6 mg/dL (ref 0.3–1.2)
Total Protein: 7.1 g/dL (ref 6.5–8.1)

## 2016-06-14 LAB — CBC
HCT: 42.1 % (ref 36.0–46.0)
HEMOGLOBIN: 13.5 g/dL (ref 12.0–15.0)
MCH: 28 pg (ref 26.0–34.0)
MCHC: 32.1 g/dL (ref 30.0–36.0)
MCV: 87.2 fL (ref 78.0–100.0)
Platelets: 294 10*3/uL (ref 150–400)
RBC: 4.83 MIL/uL (ref 3.87–5.11)
RDW: 14.5 % (ref 11.5–15.5)
WBC: 11.2 10*3/uL — AB (ref 4.0–10.5)

## 2016-06-14 LAB — I-STAT BETA HCG BLOOD, ED (MC, WL, AP ONLY)

## 2016-06-14 LAB — LIPASE, BLOOD: LIPASE: 23 U/L (ref 11–51)

## 2016-06-14 NOTE — ED Triage Notes (Signed)
Pt reports to the ED for eval of right sided abd pain, nausea, and diarrhea. Pt reports the symptoms began today around 10am. She denies any unusual PO intake, fevers, chills, urinary symptoms, or hematuria. Also denies any active vomiting. Pt has hx of endometriosis but this feels different

## 2016-06-15 ENCOUNTER — Emergency Department (HOSPITAL_COMMUNITY): Payer: Commercial Managed Care - PPO

## 2016-06-15 ENCOUNTER — Emergency Department (HOSPITAL_COMMUNITY)
Admission: EM | Admit: 2016-06-15 | Discharge: 2016-06-15 | Disposition: A | Discharge status: Home / Self Care | Payer: Commercial Managed Care - PPO | Attending: Emergency Medicine | Admitting: Emergency Medicine

## 2016-06-15 DIAGNOSIS — R1011 Right upper quadrant pain: Secondary | ICD-10-CM

## 2016-06-15 LAB — URINALYSIS, ROUTINE W REFLEX MICROSCOPIC
Bilirubin Urine: NEGATIVE
GLUCOSE, UA: NEGATIVE mg/dL
Hgb urine dipstick: NEGATIVE
Ketones, ur: 15 mg/dL — AB
Nitrite: NEGATIVE
PROTEIN: NEGATIVE mg/dL
Specific Gravity, Urine: 1.028 (ref 1.005–1.030)
pH: 6 (ref 5.0–8.0)

## 2016-06-15 LAB — URINE MICROSCOPIC-ADD ON: RBC / HPF: NONE SEEN RBC/hpf (ref 0–5)

## 2016-06-15 MED ORDER — MORPHINE SULFATE (PF) 4 MG/ML IV SOLN
4.0000 mg | Freq: Once | INTRAVENOUS | Status: AC
  Administered 2016-06-15: 4 mg via INTRAVENOUS
  Filled 2016-06-15: qty 1

## 2016-06-15 MED ORDER — ONDANSETRON 4 MG PO TBDP: 4.0000 mg | ORAL_TABLET | Freq: Three times a day (TID) | ORAL | 0 refills | Status: DC | PRN

## 2016-06-15 MED ORDER — ONDANSETRON HCL 4 MG/2ML IJ SOLN
4.0000 mg | Freq: Once | INTRAMUSCULAR | Status: AC
  Administered 2016-06-15: 4 mg via INTRAVENOUS
  Filled 2016-06-15: qty 2

## 2016-06-15 MED ORDER — HYDROCODONE-ACETAMINOPHEN 5-325 MG PO TABS: 1.0000 | ORAL_TABLET | ORAL | 0 refills | Status: DC | PRN

## 2016-06-15 NOTE — ED Notes (Signed)
This RN attempted an IV x 2 without success.

## 2016-06-15 NOTE — ED Provider Notes (Signed)
[facility]-EMERGENCY DEPT Provider Note   CSN: [id number] Arrival date & time: 06/14/16  1734     History   Chief Complaint Chief Complaint  Patient presents with  . Abdominal Pain    HPI Krystal Reeves is a 30 y.o. female.  Patient presents with RUQ abdominal pain that started yesterday around 10:30 in the morning. Pain has been constant through the day. No aggravating or alleviating factors. She has had loose but infrequent stool that is non-bloody. She denies fever, dysuria, hematuria, vaginal discharge. She reports a history of endometriosis but that current pain is not similar to this.    The history is provided by the patient. No language interpreter was used.  Abdominal Pain   This is a new problem. Associated symptoms include diarrhea and nausea. Pertinent negatives include fever and vomiting.    Past Medical History:  Diagnosis Date  . Endometriosis   . Headache     There are no active problems to display for this patient.   Past Surgical History:  Procedure Laterality Date  . APPENDECTOMY    . DIAGNOSTIC LAPAROSCOPY    . LAPAROSCOPY N/A 07/22/2015   Procedure: LAPAROSCOPY DIAGNOSTIC;  Surgeon: Vena Austria, MD;  Location: [facility] ORS;  Service: Gynecology;  Laterality: N/A;  . TUBAL LIGATION Bilateral     OB History    Gravida Para Term Preterm AB Living   3 3 3          SAB TAB Ectopic Multiple Live Births                   Home Medications    Prior to Admission medications   Medication Sig Start Date End Date Taking? Authorizing Provider  escitalopram (LEXAPRO) 20 MG tablet Take 20 mg by mouth daily.   Yes Historical Provider, MD  naproxen (NAPROSYN) 250 MG tablet Take 500 mg by mouth daily as needed for mild pain.    Yes Historical Provider, MD  HYDROcodone-acetaminophen (NORCO/VICODIN) 5-325 MG tablet Take 1 tablet by mouth every 4 (four) hours as needed. 06/15/16   Elpidio Anis, PA-C  ondansetron (ZOFRAN ODT) 4 MG disintegrating tablet Take 1 tablet  (4 mg total) by mouth every 8 (eight) hours as needed for nausea or vomiting. 06/15/16   Elpidio Anis, PA-C    Family History No family history on file.  Social History Social History  Substance Use Topics  . Smoking status: Former Smoker    Packs/day: 0.50    Years: 8.00    Types: Cigarettes  . Smokeless tobacco: Never Used  . Alcohol use Yes     Comment: occasionally     Allergies   Review of patient's allergies indicates no known allergies.   Review of Systems Review of Systems  Constitutional: Negative for chills and fever.  HENT: Negative.   Respiratory: Negative.   Cardiovascular: Negative.   Gastrointestinal: Positive for abdominal pain, diarrhea and nausea. Negative for blood in stool and vomiting.  Genitourinary: Negative.   Musculoskeletal: Negative.  Negative for back pain.  Skin: Negative.   Neurological: Negative.      Physical Exam Updated Vital Signs BP 118/82   Pulse (!) 59   Temp 98.4 F (36.9 C) (Oral)   Resp 16   SpO2 99%   Physical Exam  Constitutional: She is oriented to person, place, and time. She appears well-developed and well-nourished.  HENT:  Head: Normocephalic.  Neck: Normal range of motion. Neck supple.  Cardiovascular: Normal rate and regular rhythm.   No  murmur heard. Pulmonary/Chest: Effort normal and breath sounds normal. She has no wheezes. She has no rales.  Abdominal: Soft. Bowel sounds are normal. There is tenderness (Tenderness is limited to RUQ. No peritoneal signs, guarding or rigidity.). There is no rebound and no guarding.  Musculoskeletal: Normal range of motion.  Neurological: She is alert and oriented to person, place, and time.  Skin: Skin is warm and dry. No rash noted.  Psychiatric: She has a normal mood and affect.     ED Treatments / Results  Labs (all labs ordered are listed, but only abnormal results are displayed) Labs Reviewed  CBC - Abnormal; Notable for the following:       Result Value   WBC  11.2 (*)    All other components within normal limits  URINALYSIS, ROUTINE W REFLEX MICROSCOPIC (NOT AT [facility]) - Abnormal; Notable for the following:    APPearance CLOUDY (*)    Ketones, ur 15 (*)    Leukocytes, UA SMALL (*)    All other components within normal limits  URINE MICROSCOPIC-ADD ON - Abnormal; Notable for the following:    Squamous Epithelial / LPF 6-30 (*)    Bacteria, UA MANY (*)    All other components within normal limits  LIPASE, BLOOD  COMPREHENSIVE METABOLIC PANEL  I-STAT BETA HCG BLOOD, ED ([facility], [facility], [facility] ONLY)    EKG  EKG Interpretation None       Radiology [place] Abdomen Limited Ruq  Result Date: 06/15/2016 CLINICAL DATA:  Right upper quadrant pain, nausea, and diarrhea beginning around 10 p.m. EXAM: US ABDOMEN LIMITED - RIGHT UPPER QUADRANT COMPARISON:  None. FINDINGS: Gallbladder: Gallbladder is contracted, likely physiologic. No gallstones or wall thickening visualized. No sonographic Murphy sign noted by sonographer. Common bile duct: Diameter: 2.3 mm, normal Liver: No focal lesion identified. Within normal limits in parenchymal echogenicity. IMPRESSION: Contracted gallbladder is likely physiologic. No evidence of cholelithiasis or cholecystitis. Electronically Signed   By: Burman NievesWilliam  Stevens M.D.   On: 06/15/2016 01:46    Procedures Procedures (including critical care time)  Medications Ordered in ED Medications  morphine 4 MG/ML injection 4 mg (4 mg Intravenous Given 06/15/16 0237)  ondansetron (ZOFRAN) injection 4 mg (4 mg Intravenous Given 06/15/16 0237)     Initial Impression / Assessment and Plan / ED Course  I have reviewed the triage vital signs and the nursing notes.  Pertinent labs & imaging results that were available during my care of the patient were reviewed by me and considered in my medical decision making (see chart for details).  Clinical Course  Value Comment By Time  US Abdomen Limited RUQ (Reviewed) Elpidio AnisShari Zyheir Daft, PA-C 09/27 0229     Patient with abdominal pain x 1 day. No fever. Mild leukocytosis. US performed and is found to be negative. No vomiting in ED, with pain and nausea improved with medications. Labs otherwise unremarkable.   She is felt stable for discharge home. Will refer to PCP follow up .  Final Clinical Impressions(s) / ED Diagnoses   Final diagnoses:  Right upper quadrant pain    New Prescriptions New Prescriptions   HYDROCODONE-ACETAMINOPHEN (NORCO/VICODIN) 5-325 MG TABLET    Take 1 tablet by mouth every 4 (four) hours as needed.   ONDANSETRON (ZOFRAN ODT) 4 MG DISINTEGRATING TABLET    Take 1 tablet (4 mg total) by mouth every 8 (eight) hours as needed for nausea or vomiting.     Elpidio AnisShari Marayah Higdon, PA-C 06/23/16 0235    Layla MawKristen N Ward,  DO 07/06/16 2254

## 2016-07-20 ENCOUNTER — Encounter (HOSPITAL_COMMUNITY): Payer: Self-pay

## 2016-07-20 ENCOUNTER — Emergency Department (HOSPITAL_COMMUNITY)
Admission: EM | Admit: 2016-07-20 | Discharge: 2016-07-20 | Disposition: A | Discharge status: Home / Self Care | Payer: Commercial Managed Care - PPO | Attending: Emergency Medicine | Admitting: Emergency Medicine

## 2016-07-20 ENCOUNTER — Emergency Department (HOSPITAL_COMMUNITY): Payer: Commercial Managed Care - PPO

## 2016-07-20 DIAGNOSIS — Z8742 Personal history of other diseases of the female genital tract: Secondary | ICD-10-CM

## 2016-07-20 DIAGNOSIS — R102 Pelvic and perineal pain: Secondary | ICD-10-CM | POA: Diagnosis not present

## 2016-07-20 DIAGNOSIS — Z87891 Personal history of nicotine dependence: Secondary | ICD-10-CM | POA: Diagnosis not present

## 2016-07-20 DIAGNOSIS — R1031 Right lower quadrant pain: Secondary | ICD-10-CM | POA: Diagnosis present

## 2016-07-20 DIAGNOSIS — N809 Endometriosis, unspecified: Secondary | ICD-10-CM | POA: Insufficient documentation

## 2016-07-20 LAB — COMPREHENSIVE METABOLIC PANEL
ALBUMIN: 4.1 g/dL (ref 3.5–5.0)
ALT: 15 U/L (ref 14–54)
AST: 15 U/L (ref 15–41)
Alkaline Phosphatase: 82 U/L (ref 38–126)
Anion gap: 10 (ref 5–15)
BUN: 9 mg/dL (ref 6–20)
CHLORIDE: 106 mmol/L (ref 101–111)
CO2: 21 mmol/L — AB (ref 22–32)
CREATININE: 0.73 mg/dL (ref 0.44–1.00)
Calcium: 9.2 mg/dL (ref 8.9–10.3)
GFR calc Af Amer: >60 mL/min (ref >60)
GLUCOSE: 87 mg/dL (ref 65–99)
Potassium: 4.1 mmol/L (ref 3.5–5.1)
SODIUM: 137 mmol/L (ref 135–145)
Total Bilirubin: 0.4 mg/dL (ref 0.3–1.2)
Total Protein: 8.1 g/dL (ref 6.5–8.1)

## 2016-07-20 LAB — CBC
HEMATOCRIT: 43.4 % (ref 36.0–46.0)
Hemoglobin: 14.4 g/dL (ref 12.0–15.0)
MCH: 28.5 pg (ref 26.0–34.0)
MCHC: 33.2 g/dL (ref 30.0–36.0)
MCV: 85.8 fL (ref 78.0–100.0)
PLATELETS: 321 10*3/uL (ref 150–400)
RBC: 5.06 MIL/uL (ref 3.87–5.11)
RDW: 14.8 % (ref 11.5–15.5)
WBC: 11.8 10*3/uL — ABNORMAL HIGH (ref 4.0–10.5)

## 2016-07-20 LAB — URINALYSIS, ROUTINE W REFLEX MICROSCOPIC
Bilirubin Urine: NEGATIVE
Glucose, UA: NEGATIVE mg/dL
Hgb urine dipstick: NEGATIVE
KETONES UR: NEGATIVE mg/dL
LEUKOCYTES UA: NEGATIVE
NITRITE: NEGATIVE
PROTEIN: NEGATIVE mg/dL
Specific Gravity, Urine: 1.025 (ref 1.005–1.030)
pH: 7 (ref 5.0–8.0)

## 2016-07-20 LAB — LIPASE, BLOOD: LIPASE: 19 U/L (ref 11–51)

## 2016-07-20 LAB — HCG, QUANTITATIVE, PREGNANCY

## 2016-07-20 MED ORDER — KETOROLAC TROMETHAMINE 60 MG/2ML IM SOLN
60.0000 mg | Freq: Once | INTRAMUSCULAR | Status: AC
  Administered 2016-07-20: 60 mg via INTRAMUSCULAR
  Filled 2016-07-20: qty 2

## 2016-07-20 MED ORDER — OXYCODONE-ACETAMINOPHEN 5-325 MG PO TABS: 1.0000 | ORAL_TABLET | ORAL | 0 refills | Status: DC | PRN

## 2016-07-20 MED ORDER — OXYCODONE-ACETAMINOPHEN 5-325 MG PO TABS
1.0000 | ORAL_TABLET | ORAL | Status: AC | PRN
  Administered 2016-07-20 (×2): 1 via ORAL
  Filled 2016-07-20 (×2): qty 1

## 2016-07-20 NOTE — ED Triage Notes (Signed)
PT C/O RLQ PAIN X3 DAYS WITH N/V. PT STS SHE HAS A HX OF ENDOMETRIOSIS, AND A LUPRON INJECTION IN MAY/2017, BUT HER PAIN HAS BEEN UNBEARABLE OVER 3 DAYS, WITH HER TAKING NAPROXEN.

## 2016-07-20 NOTE — ED Provider Notes (Signed)
[facility]-EMERGENCY DEPT Provider Note   CSN: [id number] Arrival date & time: 07/20/16  1717     History   Chief Complaint Chief Complaint  Patient presents with  . Abdominal Pain    HPI Krystal Reeves is a 30 y.o. female.  She presents for evaluation of pain, typical of her endometriosis flares. The pain is in the right lower quadrant. The pain is unrelenting for 3 days. The pain is now improving when taking Naprosyn. Currently, she does not have a gynecologist. She was last treated for endometriosis with Lupron, May 2017. He has not had a period since her last Lupron injection. She denies fever, chills, nausea, vomiting, cough, shortness of breath, diarrhea, dysuria, vaginal discharge or vaginal bleeding. She has been constipated recently. There are no other known modifying factors.  HPI  Past Medical History:  Diagnosis Date  . Endometriosis   . Headache     There are no active problems to display for this patient.   Past Surgical History:  Procedure Laterality Date  . APPENDECTOMY    . DIAGNOSTIC LAPAROSCOPY    . LAPAROSCOPY N/A 07/22/2015   Procedure: LAPAROSCOPY DIAGNOSTIC;  Surgeon: Vena AustriaAndreas Staebler, MD;  Location: [facility] ORS;  Service: Gynecology;  Laterality: N/A;  . TUBAL LIGATION Bilateral     OB History    Gravida Para Term Preterm AB Living   3 3 3          SAB TAB Ectopic Multiple Live Births                   Home Medications    Prior to Admission medications   Medication Sig Start Date End Date Taking? Authorizing Provider  escitalopram (LEXAPRO) 20 MG tablet Take 20 mg by mouth daily.   Yes Historical Provider, MD  naproxen (NAPROSYN) 500 MG tablet TK 1 T PO BID WITH MEALS 07/15/16  Yes Historical Provider, MD  ondansetron (ZOFRAN ODT) 4 MG disintegrating tablet Take 1 tablet (4 mg total) by mouth every 8 (eight) hours as needed for nausea or vomiting. 06/15/16  Yes Elpidio AnisShari Upstill, PA-C  HYDROcodone-acetaminophen (NORCO/VICODIN) 5-325 MG tablet Take 1  tablet by mouth every 4 (four) hours as needed. Patient not taking: Reported on 07/20/2016 06/15/16   Elpidio AnisShari Upstill, PA-C  oxyCODONE-acetaminophen (PERCOCET) 5-325 MG tablet Take 1 tablet by mouth every 4 (four) hours as needed for severe pain. 07/20/16   Mancel BaleElliott Carmin Alvidrez, MD    Family History History reviewed. No pertinent family history.  Social History Social History  Substance Use Topics  . Smoking status: Former Smoker    Packs/day: 0.50    Years: 8.00    Types: Cigarettes  . Smokeless tobacco: Never Used  . Alcohol use Yes     Comment: occasionally     Allergies   Review of patient's allergies indicates no known allergies.   Review of Systems Review of Systems  All other systems reviewed and are negative.    Physical Exam Updated Vital Signs BP 124/70 (BP Location: Right Arm)   Pulse 62   Temp 98.3 F (36.8 C) (Oral)   Resp 17   Ht 5\' 6"  (1.676 m)   Wt 220 lb (99.8 kg)   SpO2 100%   BMI 35.51 kg/m   Physical Exam  Constitutional: She is oriented to person, place, and time. She appears well-developed and well-nourished.  HENT:  Head: Normocephalic and atraumatic.  Eyes: Conjunctivae and EOM are normal. Pupils are equal, round, and reactive to light.  Neck: Normal  range of motion and phonation normal. Neck supple.  Cardiovascular: Normal rate and regular rhythm.   Pulmonary/Chest: Effort normal and breath sounds normal. She exhibits no tenderness.  Abdominal: Soft. She exhibits no distension and no mass. There is tenderness (Right lower quadrant, mild). There is no rebound and no guarding.  Musculoskeletal: Normal range of motion.  Neurological: She is alert and oriented to person, place, and time. She exhibits normal muscle tone.  Skin: Skin is warm and dry.  Psychiatric: She has a normal mood and affect. Her behavior is normal. Judgment and thought content normal.  Nursing note and vitals reviewed.    ED Treatments / Results  Labs (all labs ordered are  listed, but only abnormal results are displayed) Labs Reviewed  COMPREHENSIVE METABOLIC PANEL - Abnormal; Notable for the following:       Result Value   CO2 21 (*)    All other components within normal limits  CBC - Abnormal; Notable for the following:    WBC 11.8 (*)    All other components within normal limits  URINALYSIS, ROUTINE W REFLEX MICROSCOPIC (NOT AT [facility]) - Abnormal; Notable for the following:    APPearance CLOUDY (*)    All other components within normal limits  LIPASE, BLOOD  HCG, QUANTITATIVE, PREGNANCY    EKG  EKG Interpretation None       Radiology US Transvaginal Non-ob  Result Date: 07/20/2016 CLINICAL DATA:  Pelvic pain, history of endometriosis EXAM: TRANSABDOMINAL AND TRANSVAGINAL ULTRASOUND OF PELVIS TECHNIQUE: Both transabdominal and transvaginal ultrasound examinations of the pelvis were performed. Transabdominal technique was performed for global imaging of the pelvis including uterus, ovaries, adnexal regions, and pelvic cul-de-sac. It was necessary to proceed with endovaginal exam following the transabdominal exam to visualize the uterus, both ovaries and endometrium. COMPARISON:  06/11/2015 FINDINGS: Uterus Measurements: 6.6 x 4.3 x 4.3 cm. The uterus is retroverted. No fibroids or other mass visualized. Endometrium Thickness: 4.3 mm.  No focal abnormality visualized. Right ovary Measurements: 3 x 2 x 2.2 cm. Overall volume 6.6 cubic cm. Normal appearance/no adnexal mass. Left ovary Measurements: 3.2 x 2.3 x 2.6 cm. Overall volume 9.9 cubic cm. Normal appearance/no adnexal mass. Other findings No abnormal free fluid. IMPRESSION: Retroverted uterus.  No acute intrapelvic abnormality. Electronically Signed   By: Tollie Eth M.D.   On: 07/20/2016 21:22   US Pelvis Complete  Result Date: 07/20/2016 CLINICAL DATA:  Pelvic pain, history of endometriosis EXAM: TRANSABDOMINAL AND TRANSVAGINAL ULTRASOUND OF PELVIS TECHNIQUE: Both transabdominal and transvaginal  ultrasound examinations of the pelvis were performed. Transabdominal technique was performed for global imaging of the pelvis including uterus, ovaries, adnexal regions, and pelvic cul-de-sac. It was necessary to proceed with endovaginal exam following the transabdominal exam to visualize the uterus, both ovaries and endometrium. COMPARISON:  06/11/2015 FINDINGS: Uterus Measurements: 6.6 x 4.3 x 4.3 cm. The uterus is retroverted. No fibroids or other mass visualized. Endometrium Thickness: 4.3 mm.  No focal abnormality visualized. Right ovary Measurements: 3 x 2 x 2.2 cm. Overall volume 6.6 cubic cm. Normal appearance/no adnexal mass. Left ovary Measurements: 3.2 x 2.3 x 2.6 cm. Overall volume 9.9 cubic cm. Normal appearance/no adnexal mass. Other findings No abnormal free fluid. IMPRESSION: Retroverted uterus.  No acute intrapelvic abnormality. Electronically Signed   By: Tollie Eth M.D.   On: 07/20/2016 21:22    Procedures Procedures (including critical care time)  Medications Ordered in ED Medications  oxyCODONE-acetaminophen (PERCOCET/ROXICET) 5-325 MG per tablet 1 tablet (1 tablet Oral  Given 07/20/16 1907)  ketorolac (TORADOL) injection 60 mg (60 mg Intramuscular Given 07/20/16 2103)     Initial Impression / Assessment and Plan / ED Course  I have reviewed the triage vital signs and the nursing notes.  Pertinent labs & imaging results that were available during my care of the patient were reviewed by me and considered in my medical decision making (see chart for details).  Clinical Course  Value Comment By Time  WBC: (!) 11.8 Mild elevation Mancel Bale, MD 10/24 1928  CO2: (!) 21 Slightly low Mancel Bale, MD 10/24 1928   She states that after 2 Percocet pills given by protocol, she has not had relief of her pain. Therefore, imaging will be ordered Mancel Bale, MD 10/24 1933    Medications  oxyCODONE-acetaminophen (PERCOCET/ROXICET) 5-325 MG per tablet 1 tablet (1 tablet Oral  Given 07/20/16 1907)  ketorolac (TORADOL) injection 60 mg (60 mg Intramuscular Given 07/20/16 2103)    Patient Vitals for the past 24 hrs:  BP Temp Temp src Pulse Resp SpO2 Height Weight  07/20/16 2104 124/70 - - 62 17 100 % - -  07/20/16 1858 133/75 98.3 F (36.8 C) Oral 72 18 100 % - -  07/20/16 1736 - - - - - - 5\' 6"  (1.676 m) 220 lb (99.8 kg)  07/20/16 1735 121/83 98.5 F (36.9 C) Oral 71 20 98 % - -    10:25 PM Reevaluation with update and discussion. After initial assessment and treatment, an updated evaluation reveals She is comfortable at this time. No further complaints.Findings discussed with patient and all questions answered. Cathryn Gallery L    Final Clinical Impressions(s) / ED Diagnoses   Final diagnoses:  Pelvic pain  Endometriosis   Nonspecific pelvic pain with history of endometriosis. Patient not pregnant. No symptoms of vaginal bleeding or discharge. No abnormalities on pelvic ultrasound imaging.  Nursing Notes Reviewed/ Care Coordinated Applicable Imaging Reviewed Interpretation of Laboratory Data incorporated into ED treatment  The patient appears reasonably screened and/or stabilized for discharge and I doubt any other medical condition or other [facility] requiring further screening, evaluation, or treatment in the ED at this time prior to discharge.  Plan: Home Medications- continue; Home Treatments- rest; return here if the recommended treatment, does not improve the symptoms; Recommended follow up- GYN 1 week    New Prescriptions New Prescriptions   OXYCODONE-ACETAMINOPHEN (PERCOCET) 5-325 MG TABLET    Take 1 tablet by mouth every 4 (four) hours as needed for severe pain.     Mancel Bale, MD 07/20/16 2228

## 2016-08-01 ENCOUNTER — Encounter (HOSPITAL_COMMUNITY): Payer: Self-pay | Admitting: *Deleted

## 2016-08-01 ENCOUNTER — Emergency Department (HOSPITAL_COMMUNITY): Payer: Commercial Managed Care - PPO

## 2016-08-01 ENCOUNTER — Emergency Department (HOSPITAL_COMMUNITY)
Admission: EM | Admit: 2016-08-01 | Discharge: 2016-08-01 | Disposition: A | Discharge status: Home / Self Care | Payer: Commercial Managed Care - PPO | Attending: Emergency Medicine | Admitting: Emergency Medicine

## 2016-08-01 DIAGNOSIS — M79644 Pain in right finger(s): Secondary | ICD-10-CM

## 2016-08-01 DIAGNOSIS — Y9389 Activity, other specified: Secondary | ICD-10-CM | POA: Insufficient documentation

## 2016-08-01 DIAGNOSIS — Y999 Unspecified external cause status: Secondary | ICD-10-CM | POA: Diagnosis not present

## 2016-08-01 DIAGNOSIS — X58XXXA Exposure to other specified factors, initial encounter: Secondary | ICD-10-CM | POA: Insufficient documentation

## 2016-08-01 DIAGNOSIS — Z87891 Personal history of nicotine dependence: Secondary | ICD-10-CM | POA: Insufficient documentation

## 2016-08-01 DIAGNOSIS — Y929 Unspecified place or not applicable: Secondary | ICD-10-CM | POA: Diagnosis not present

## 2016-08-01 NOTE — ED Provider Notes (Signed)
[facility]-EMERGENCY DEPT Provider Note   CSN: [id number] Arrival date & time: 08/01/16  1744   By signing my name below, I, Clovis PuAvnee Patel, attest that this documentation has been prepared under the direction and in the presence of  [facility] HospitalJaime Ward,PA-C. Electronically Signed: Clovis PuAvnee Patel, ED Scribe. 08/01/16. 6:19 PM.   History   Chief Complaint Chief Complaint  Patient presents with  . Finger Injury   The history is provided by the patient. No language interpreter was used.   HPI Comments:  Krystal Reeves is a 30 y.o. female who presents to the Emergency Department complaining of sudden onset "throbbing, 5/10" right ring finger pain s/p an incident which occurred last night. Pt states she was playing with her daughter when she injured her finger but cannot recall the exact mechanism or what happened. Her pain is exacerbated to the touch and with movement. She notes associated swelling. Pt has taken naproxen last night which helped with swelling and percocet 1 hour ago with adequate relief of pain. She denies tingling/numbness to the area, laceration/wound/erythema to her extremity and any other complaints at this time.   Past Medical History:  Diagnosis Date  . Endometriosis   . Headache     There are no active problems to display for this patient.   Past Surgical History:  Procedure Laterality Date  . APPENDECTOMY    . DIAGNOSTIC LAPAROSCOPY    . LAPAROSCOPY N/A 07/22/2015   Procedure: LAPAROSCOPY DIAGNOSTIC;  Surgeon: Vena AustriaAndreas Staebler, MD;  Location: [facility] ORS;  Service: Gynecology;  Laterality: N/A;  . TUBAL LIGATION Bilateral     OB History    Gravida Para Term Preterm AB Living   3 3 3          SAB TAB Ectopic Multiple Live Births                   Home Medications    Prior to Admission medications   Medication Sig Start Date End Date Taking? Authorizing Provider  escitalopram (LEXAPRO) 20 MG tablet Take 20 mg by mouth daily.    Historical Provider, MD    HYDROcodone-acetaminophen (NORCO/VICODIN) 5-325 MG tablet Take 1 tablet by mouth every 4 (four) hours as needed. Patient not taking: Reported on 07/20/2016 06/15/16   Elpidio AnisShari Upstill, PA-C  naproxen (NAPROSYN) 500 MG tablet TK 1 T PO BID WITH MEALS 07/15/16   Historical Provider, MD  ondansetron (ZOFRAN ODT) 4 MG disintegrating tablet Take 1 tablet (4 mg total) by mouth every 8 (eight) hours as needed for nausea or vomiting. 06/15/16   Elpidio AnisShari Upstill, PA-C  oxyCODONE-acetaminophen (PERCOCET) 5-325 MG tablet Take 1 tablet by mouth every 4 (four) hours as needed for severe pain. 07/20/16   Mancel BaleElliott Wentz, MD    Family History No family history on file.  Social History Social History  Substance Use Topics  . Smoking status: Former Smoker    Packs/day: 0.50    Years: 8.00    Types: Cigarettes  . Smokeless tobacco: Never Used  . Alcohol use Yes     Comment: occasionally     Allergies   Patient has no known allergies.   Review of Systems Review of Systems  Musculoskeletal: Positive for arthralgias and joint swelling.  Skin: Negative for wound.  Neurological: Negative for numbness.     Physical Exam Updated Vital Signs BP 138/92 (BP Location: Left Arm)   Pulse 105   Temp 98.4 F (36.9 C) (Oral)   Resp 18   SpO2 98%  Physical Exam  Constitutional: She is oriented to person, place, and time. She appears well-developed and well-nourished. No distress.  HENT:  Head: Normocephalic and atraumatic.  Cardiovascular: Normal rate, regular rhythm, normal heart sounds and intact distal pulses.  Exam reveals no gallop and no friction rub.   No murmur heard. Pulmonary/Chest: Effort normal and breath sounds normal. No respiratory distress. She has no wheezes. She has no rales. She exhibits no tenderness.  Abdominal: Soft. Bowel sounds are normal. She exhibits no distension. There is no tenderness.  Musculoskeletal: She exhibits tenderness.  Right ring finger with tenderness to palpation  of PIP joint. Mild swelling. Decreased ROM of the PIP joint. No open wounds, erythema, or warmth to the touch. The patient has normal sensation and motor function in the median, ulnar, and radial nerve distributions. There is no anatomic snuff box tenderness. The patient has normal active and passive range of motion of their digits. 2+ radial pulse.  Neurological: She is alert and oriented to person, place, and time.  Skin: Skin is warm and dry.  Nursing note and vitals reviewed.   ED Treatments / Results  DIAGNOSTIC STUDIES:  Oxygen Saturation is 98% on RA, normal by my interpretation.    COORDINATION OF CARE:  6:18 PM Discussed treatment plan with pt at bedside and pt agreed to plan.  Labs (all labs ordered are listed, but only abnormal results are displayed) Labs Reviewed - No data to display  EKG  EKG Interpretation None       Radiology Dg Finger Ring Right  Result Date: 08/01/2016 CLINICAL DATA:  Injured finger. EXAM: RIGHT RING FINGER 2+V COMPARISON:  None. FINDINGS: The joint spaces are maintained.  No acute fracture. IMPRESSION: No acute bony findings. Electronically Signed   By: Rudie MeyerP.  Gallerani M.D.   On: 08/01/2016 19:26    Procedures Procedures (including critical care time)  Medications Ordered in ED Medications - No data to display   Initial Impression / Assessment and Plan / ED Course  I have reviewed the triage vital signs and the nursing notes.  Pertinent labs & imaging results that were available during my care of the patient were reviewed by me and considered in my medical decision making (see chart for details).  Clinical Course    Krystal Reeves is a 30 y.o. female who presents to ED for right ring finger pain. On exam, right upper extremity is neurovascularly intact. She does have mild swelling and decreased range of motion of the PIP joint. X-ray was obtained and unremarkable. Splint provided in ED. Patient encouraged to follow up with orthopedic if  symptoms persist. Rice instructions discussed. Patient states that she has a prescription for naproxen already home and does not need further anti-inflammatory pain medications. Reasons for return to the ER were discussed and all questions were answered.  Final Clinical Impressions(s) / ED Diagnoses   Final diagnoses:  Pain of finger of right hand    New Prescriptions New Prescriptions   No medications on file   I personally performed the services described in this documentation, which was scribed in my presence. The recorded information has been reviewed and is accurate.     [facility] LLCJaime Pilcher Ward, PA-C 08/01/16 1947    Alvira MondayErin Schlossman, MD 08/02/16 1300

## 2016-08-01 NOTE — Discharge Instructions (Signed)
Ice affected area for pain relief. Take naproxen as needed for pain relief as well. If symptoms persist on Wednesday, please call the orthopedist listed above to schedule a follow-up appointment. Return to the ER for new or worsening symptoms, any additional concerns.

## 2016-08-01 NOTE — ED Triage Notes (Signed)
The pt is c/o pain  In  Her rt ring finger she injured it yesterday playing with her daughter  L;mp none  On meds for it

## 2016-08-18 ENCOUNTER — Emergency Department (HOSPITAL_COMMUNITY)
Admission: EM | Admit: 2016-08-18 | Discharge: 2016-08-19 | Disposition: A | Discharge status: Home / Self Care | Payer: Commercial Managed Care - PPO | Attending: Emergency Medicine | Admitting: Emergency Medicine

## 2016-08-18 ENCOUNTER — Encounter (HOSPITAL_COMMUNITY): Payer: Self-pay | Admitting: *Deleted

## 2016-08-18 DIAGNOSIS — R1030 Lower abdominal pain, unspecified: Secondary | ICD-10-CM | POA: Diagnosis present

## 2016-08-18 DIAGNOSIS — R102 Pelvic and perineal pain: Secondary | ICD-10-CM | POA: Insufficient documentation

## 2016-08-18 DIAGNOSIS — Z87891 Personal history of nicotine dependence: Secondary | ICD-10-CM | POA: Diagnosis not present

## 2016-08-18 LAB — COMPREHENSIVE METABOLIC PANEL
ALT: 15 U/L (ref 14–54)
AST: 16 U/L (ref 15–41)
Albumin: 3.4 g/dL — ABNORMAL LOW (ref 3.5–5.0)
Alkaline Phosphatase: 67 U/L (ref 38–126)
Anion gap: 8 (ref 5–15)
BUN: 7 mg/dL (ref 6–20)
CHLORIDE: 108 mmol/L (ref 101–111)
CO2: 22 mmol/L (ref 22–32)
CREATININE: 0.66 mg/dL (ref 0.44–1.00)
Calcium: 8.7 mg/dL — ABNORMAL LOW (ref 8.9–10.3)
Glucose, Bld: 89 mg/dL (ref 65–99)
Potassium: 3.7 mmol/L (ref 3.5–5.1)
Sodium: 138 mmol/L (ref 135–145)
Total Bilirubin: <0.1 mg/dL — ABNORMAL LOW (ref 0.3–1.2)
Total Protein: 6.6 g/dL (ref 6.5–8.1)

## 2016-08-18 LAB — CBC
HCT: 40.3 % (ref 36.0–46.0)
Hemoglobin: 13.2 g/dL (ref 12.0–15.0)
MCH: 28.2 pg (ref 26.0–34.0)
MCHC: 32.8 g/dL (ref 30.0–36.0)
MCV: 86.1 fL (ref 78.0–100.0)
PLATELETS: 295 10*3/uL (ref 150–400)
RBC: 4.68 MIL/uL (ref 3.87–5.11)
RDW: 15 % (ref 11.5–15.5)
WBC: 13 10*3/uL — AB (ref 4.0–10.5)

## 2016-08-18 LAB — URINALYSIS, ROUTINE W REFLEX MICROSCOPIC
Bilirubin Urine: NEGATIVE
GLUCOSE, UA: NEGATIVE mg/dL
Hgb urine dipstick: NEGATIVE
Ketones, ur: NEGATIVE mg/dL
LEUKOCYTES UA: NEGATIVE
Nitrite: NEGATIVE
PROTEIN: NEGATIVE mg/dL
Specific Gravity, Urine: 1.021 (ref 1.005–1.030)
pH: 7.5 (ref 5.0–8.0)

## 2016-08-18 LAB — LIPASE, BLOOD: LIPASE: 25 U/L (ref 11–51)

## 2016-08-18 LAB — POC URINE PREG, ED: PREG TEST UR: NEGATIVE

## 2016-08-18 MED ORDER — MORPHINE SULFATE (PF) 4 MG/ML IV SOLN
4.0000 mg | Freq: Once | INTRAVENOUS | Status: AC
  Administered 2016-08-19: 4 mg via INTRAVENOUS
  Filled 2016-08-18: qty 1

## 2016-08-18 MED ORDER — SODIUM CHLORIDE 0.9 % IV BOLUS (SEPSIS)
1000.0000 mL | Freq: Once | INTRAVENOUS | Status: AC
  Administered 2016-08-19: 1000 mL via INTRAVENOUS

## 2016-08-18 MED ORDER — KETOROLAC TROMETHAMINE 30 MG/ML IJ SOLN
30.0000 mg | Freq: Once | INTRAMUSCULAR | Status: AC
  Administered 2016-08-19: 30 mg via INTRAVENOUS
  Filled 2016-08-18: qty 1

## 2016-08-18 NOTE — ED Triage Notes (Signed)
The pt is c/o abd pain for approx 4-5 hours no nv or diarrhea  Hx of endometriousis

## 2016-08-19 MED ORDER — DIPHENHYDRAMINE HCL 50 MG/ML IJ SOLN
INTRAMUSCULAR | Status: AC
  Filled 2016-08-19: qty 1

## 2016-08-19 MED ORDER — IBUPROFEN 800 MG PO TABS: 800.0000 mg | ORAL_TABLET | Freq: Three times a day (TID) | ORAL | 0 refills | Status: DC | PRN

## 2016-08-19 MED ORDER — HYDROCODONE-ACETAMINOPHEN 5-325 MG PO TABS: 1.0000 | ORAL_TABLET | Freq: Four times a day (QID) | ORAL | 0 refills | Status: DC | PRN

## 2016-08-19 MED ORDER — FENTANYL CITRATE (PF) 100 MCG/2ML IJ SOLN
50.0000 ug | Freq: Once | INTRAMUSCULAR | Status: AC
  Administered 2016-08-19: 50 ug via INTRAVENOUS
  Filled 2016-08-19: qty 2

## 2016-08-19 MED ORDER — DIPHENHYDRAMINE HCL 50 MG/ML IJ SOLN
25.0000 mg | Freq: Once | INTRAMUSCULAR | Status: AC
  Administered 2016-08-19: 25 mg via INTRAVENOUS

## 2016-08-19 NOTE — ED Provider Notes (Signed)
[facility]-EMER[facility] DEPT Provider Note   CSN: [id number] Arrival date & time: 08/18/16  2143     History   Chief Complaint Chief Complaint  Patient presents with  . Abdominal Pain    HPI Krystal Reeves is a 30 y.o. female.  HPI Patient presents to the emergency department with lower abdominal discomfort from endometriosis.  Patient states this is similar to pain she has had in the past.  She states she is currently being treated by her GYN doctor for endometriosis and this pain is consistent with that.  Patient states nothing seems make the condition better or worse.  She states she takes naproxen normally, but tonight this did not seem to help. The patient denies chest pain, shortness of breath, headache,blurred vision, neck pain, fever, cough, weakness, numbness, dizziness, anorexia, edema, abdominal pain, nausea, vomiting, diarrhea, rash, back pain, dysuria, hematemesis, bloody stool, near syncope, or syncope. Past Medical History:  Diagnosis Date  . Endometriosis   . Headache     There are no active problems to display for this patient.   Past Surgical History:  Procedure Laterality Date  . APPENDECTOMY    . DIAGNOSTIC LAPAROSCOPY    . LAPAROSCOPY N/A 07/22/2015   Procedure: LAPAROSCOPY DIAGNOSTIC;  Surgeon: Vena AustriaAndreas Staebler, MD;  Location: [facility] ORS;  Service: Gynecology;  Laterality: N/A;  . TUBAL LIGATION Bilateral     OB History    Gravida Para Term Preterm AB Living   3 3 3          SAB TAB Ectopic Multiple Live Births                   Home Medications    Prior to Admission medications   Medication Sig Start Date End Date Taking? Authorizing Provider  escitalopram (LEXAPRO) 20 MG tablet Take 20 mg by mouth daily.   Yes Historical Provider, MD  naproxen (NAPROSYN) 500 MG tablet TK 1 T PO BID WITH MEALS AS NEEDED FOR PAIN. 07/15/16  Yes Historical Provider, MD  ondansetron (ZOFRAN ODT) 4 MG disintegrating tablet Take 1 tablet (4 mg total) by mouth every 8 (eight)  hours as needed for nausea or vomiting. 06/15/16  Yes Elpidio AnisShari Upstill, PA-C  oxyCODONE-acetaminophen (PERCOCET) 5-325 MG tablet Take 1 tablet by mouth every 4 (four) hours as needed for severe pain. 07/20/16  Yes Mancel BaleElliott Wentz, MD    Family History No family history on file.  Social History Social History  Substance Use Topics  . Smoking status: Former Smoker    Packs/day: 0.50    Years: 8.00    Types: Cigarettes  . Smokeless tobacco: Never Used  . Alcohol use Yes     Comment: occasionally     Allergies   Patient has no known allergies.   Review of Systems Review of Systems All other systems negative except as documented in the HPI. All pertinent positives and negatives as reviewed in the HPI.  Physical Exam Updated Vital Signs BP 113/80 (BP Location: Right Arm)   Pulse 63   Temp 97.8 F (36.6 C) (Oral)   Resp 16   Ht 5\' 6"  (1.676 m)   Wt 90.7 kg   SpO2 98%   BMI 32.28 kg/m   Physical Exam  Constitutional: She is oriented to person, place, and time. She appears well-developed and well-nourished. No distress.  HENT:  Head: Normocephalic and atraumatic.  Mouth/Throat: Oropharynx is clear and moist.  Eyes: Pupils are equal, round, and reactive to light.  Neck: Normal range of  motion. Neck supple.  Cardiovascular: Normal rate, regular rhythm and normal heart sounds.  Exam reveals no gallop and no friction rub.   No murmur heard. Pulmonary/Chest: Effort normal and breath sounds normal. No respiratory distress. She has no wheezes.  Abdominal: Soft. Bowel sounds are normal. She exhibits no distension and no mass. There is tenderness. There is no rebound and no guarding.  Neurological: She is alert and oriented to person, place, and time. She exhibits normal muscle tone. Coordination normal.  Skin: Skin is warm and dry. No rash noted. No erythema.  Psychiatric: She has a normal mood and affect. Her behavior is normal.  Nursing note and vitals reviewed.    ED Treatments  / Results  Labs (all labs ordered are listed, but only abnormal results are displayed) Labs Reviewed  COMPREHENSIVE METABOLIC PANEL - Abnormal; Notable for the following:       Result Value   Calcium 8.7 (*)    Albumin 3.4 (*)    Total Bilirubin <0.1 (*)    All other components within normal limits  CBC - Abnormal; Notable for the following:    WBC 13.0 (*)    All other components within normal limits  LIPASE, BLOOD  URINALYSIS, ROUTINE W REFLEX MICROSCOPIC (NOT [facility])  POC URINE PREG, ED    EKG  EKG Interpretation None       Radiology No results found.  Procedures Procedures (including critical care time)  Medications Ordered in ED Medications  sodium chloride 0.9 % bolus 1,000 mL (1,000 mLs Intravenous New Bag/Given 08/19/16 0002)  morphine 4 MG/ML injection 4 mg (4 mg Intravenous Given 08/19/16 0002)  ketorolac (TORADOL) 30 MG/ML injection 30 mg (30 mg Intravenous Given 08/19/16 0002)  diphenhydrAMINE (BENADRYL) injection 25 mg (25 mg Intravenous Given 08/19/16 0015)     Initial Impression / Assessment and Plan / ED Course  I have reviewed the triage vital signs and the nursing notes.  Pertinent labs & imaging results that were available during my care of the patient were reviewed by me and considered in my medical decision making (see chart for details).  Clinical Course    Patient be referred back to her GYN doctor and not feel that imaging is required at this time.  Patient's pain is consistent with previous syndrome home with pain control.  Asked to return here as needed   Final Clinical Impressions(s) / ED Diagnoses   Final diagnoses:  None    New Prescriptions New Prescriptions   No medications on file     Charlestine NightChristopher Shavy Beachem, PA-C 08/19/16 0109    Tomasita CrumbleAdeleke Oni, MD 08/19/16 1338

## 2016-08-19 NOTE — ED Notes (Signed)
Patient able to ambulate independently  

## 2016-08-19 NOTE — Discharge Instructions (Signed)
Follow-up with her primary care doctor or GYN.  Return here as needed.  Your testing did not show any significant abnormality

## 2016-08-19 NOTE — ED Notes (Signed)
Hives have decreased on pt's arm, pt denies any itching or continued hives/swelling

## 2016-08-19 NOTE — ED Notes (Signed)
Pt beginning to have hives at site of IV.  C/o itching.  Will make EDP aware

## 2016-08-25 ENCOUNTER — Other Ambulatory Visit: Payer: Self-pay | Admitting: Sports Medicine

## 2016-08-25 DIAGNOSIS — M79641 Pain in right hand: Secondary | ICD-10-CM

## 2016-08-28 IMAGING — US US TRANSVAGINAL NON-OB
1 series · 13 of 25 positions shown · non-contrast
Comparison: None

CLINICAL DATA: Right lower quadrant pain since noon today

EXAM:
TRANSABDOMINAL AND TRANSVAGINAL ULTRASOUND OF PELVIS
DOPPLER ULTRASOUND OF OVARIES
TECHNIQUE: Both transabdominal and transvaginal ultrasound examinations of the
pelvis were performed. Transabdominal technique was performed for
global imaging of the pelvis including uterus, ovaries, adnexal
regions, and pelvic cul-de-sac.
It was necessary to proceed with endovaginal exam following the
transabdominal exam to visualize the endometrium and ovaries. Color
and duplex Doppler ultrasound was utilized to evaluate blood flow to
the ovaries.

[Series 1: us transvaginal non-ob · 0.20mm/px · 13 of 139 slices shown]
[im 1/139]
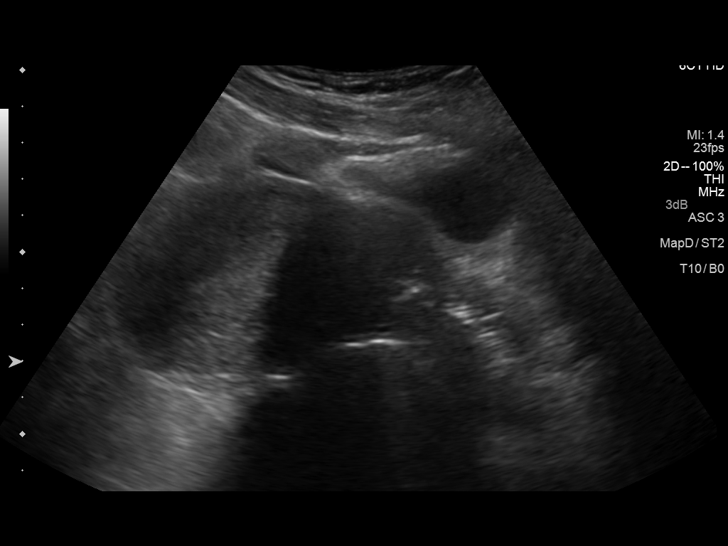
[im 12/139]
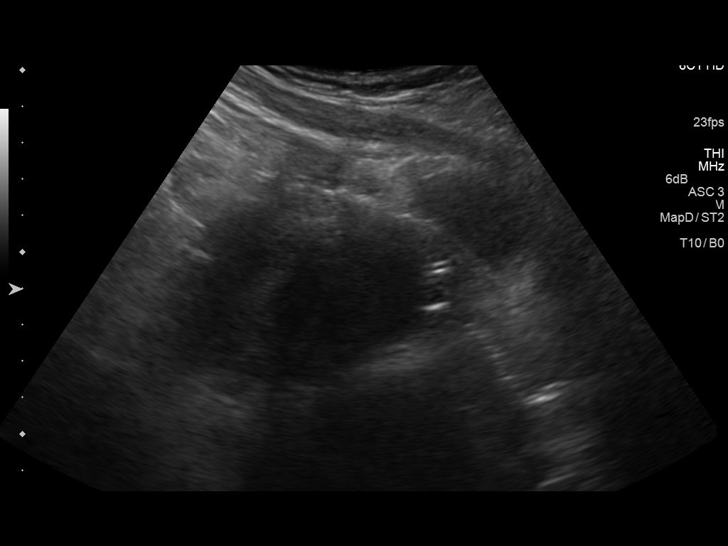
[im 24/139]
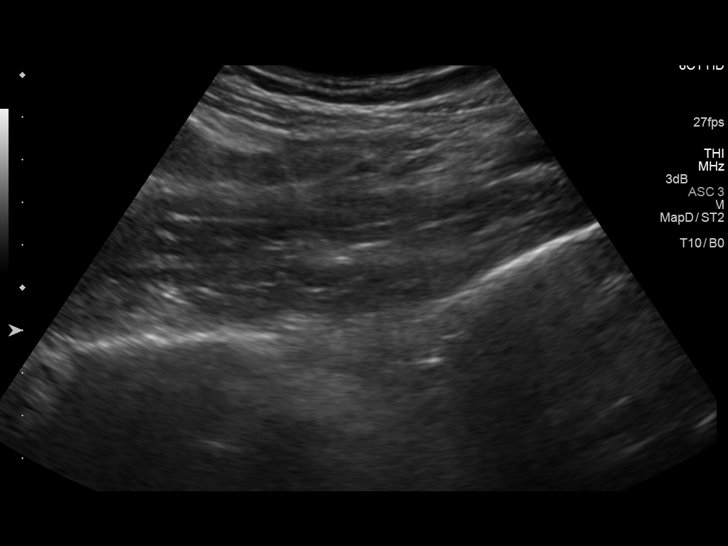
[im 35/139]
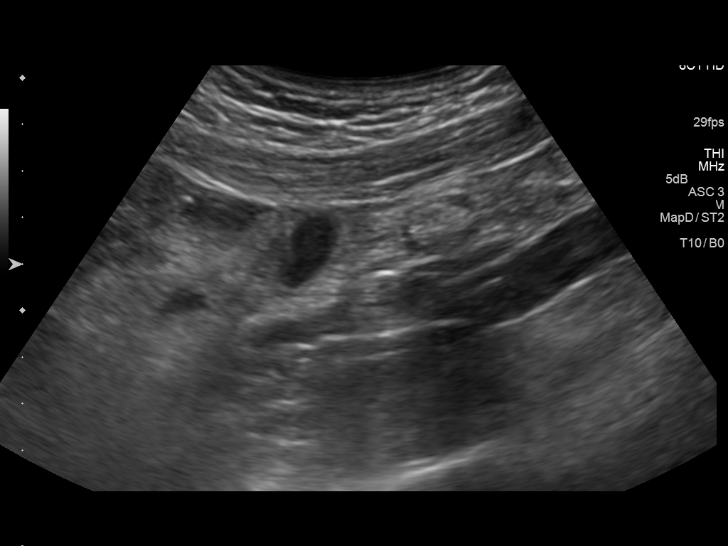
[im 47/139]
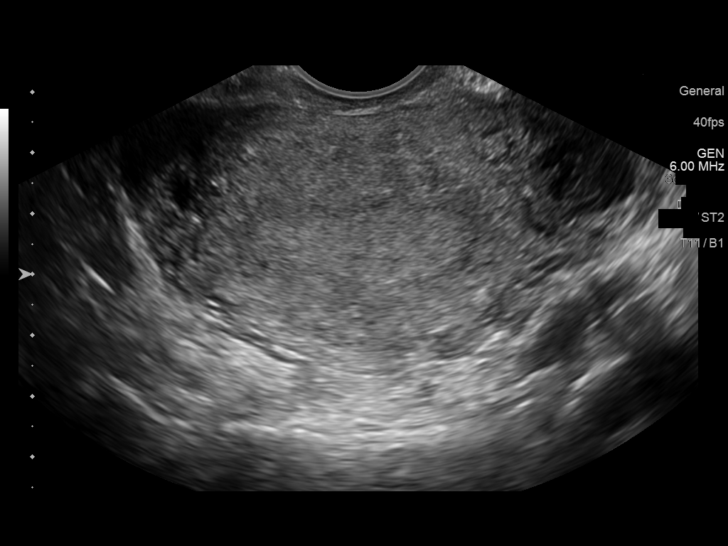
[im 58/139]
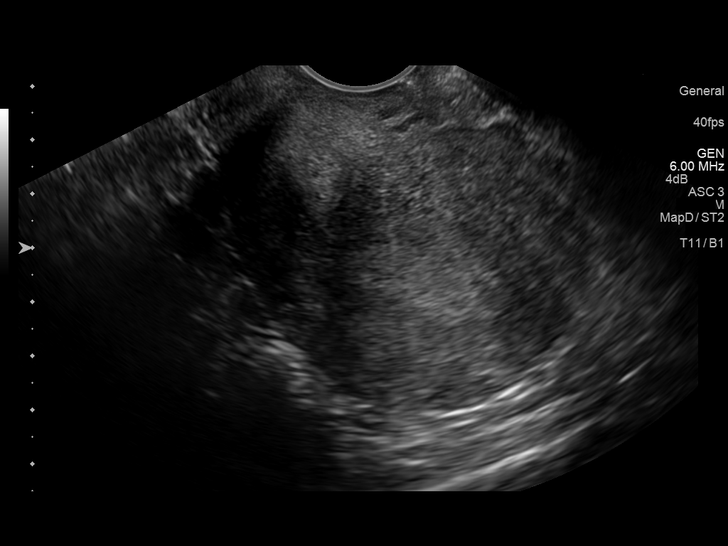
[im 70/139]
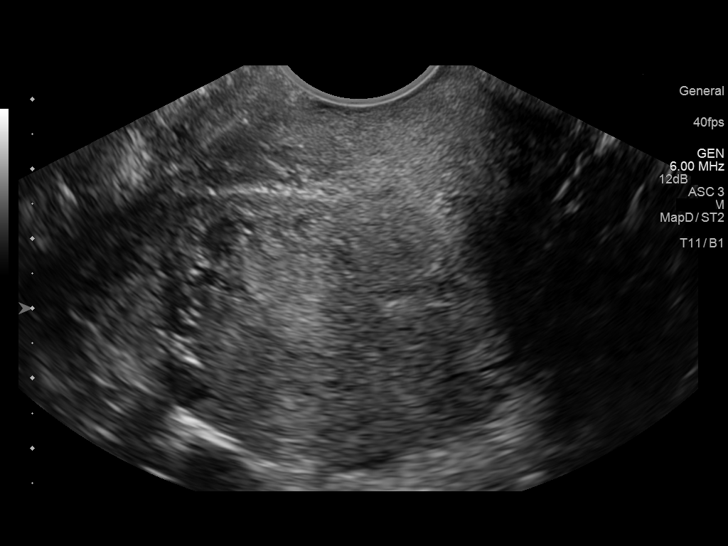
[im 81/139]
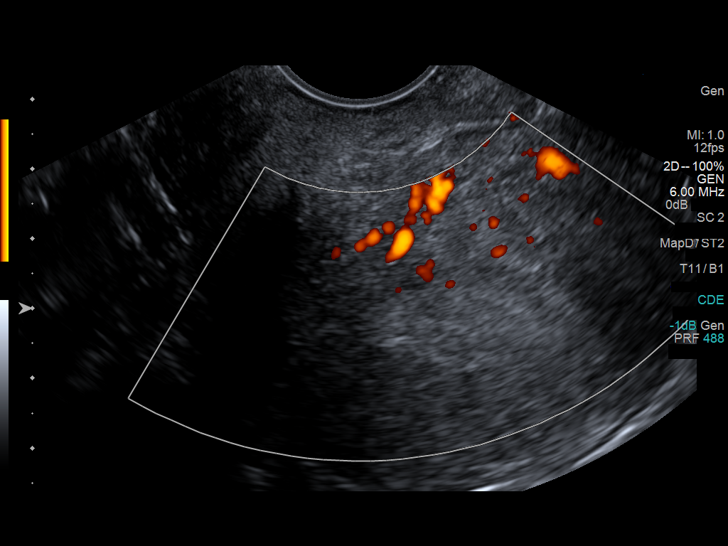
[im 93/139]
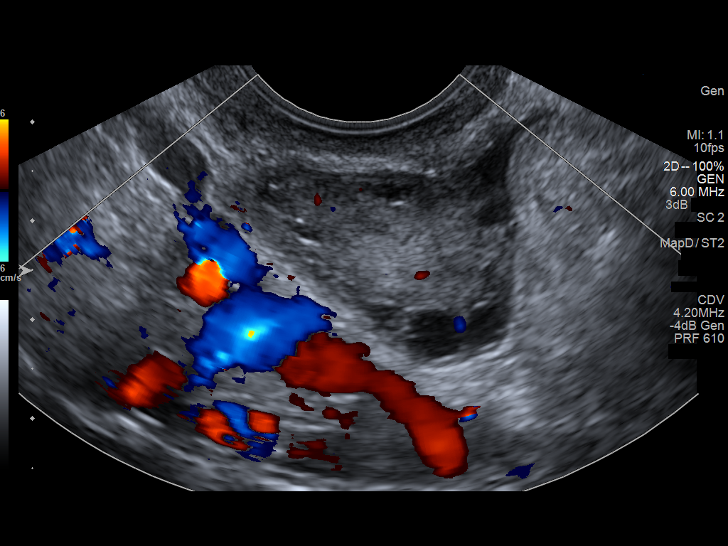
[im 104/139]
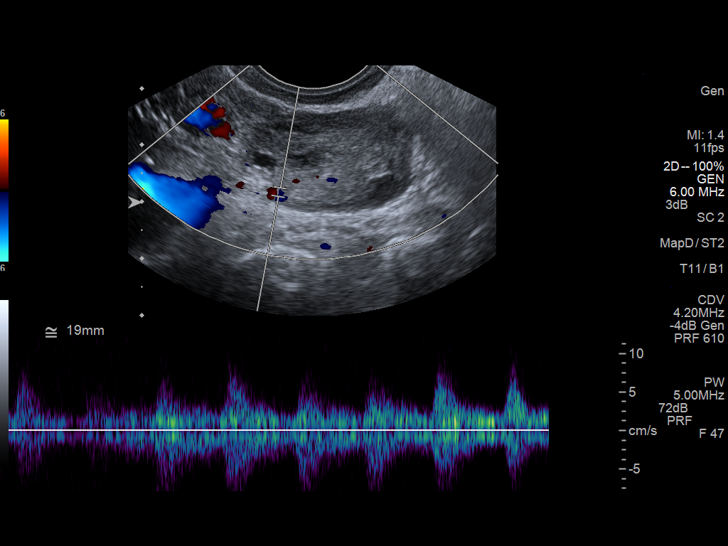
[im 116/139]
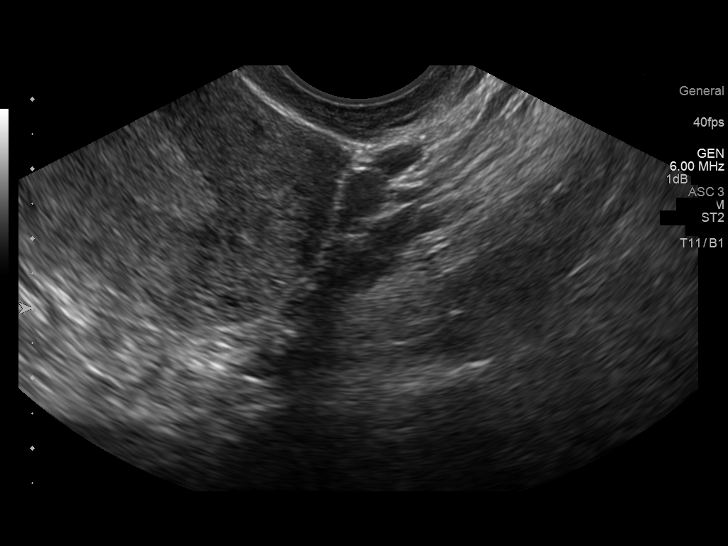
[im 127/139]
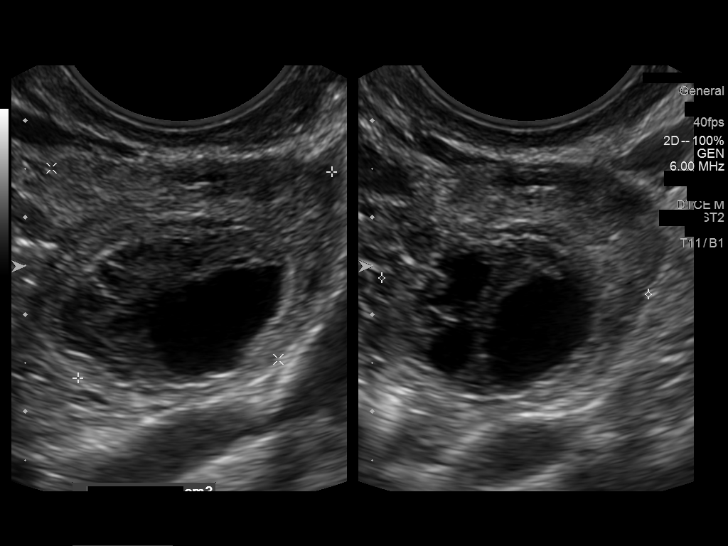
[im 139/139]
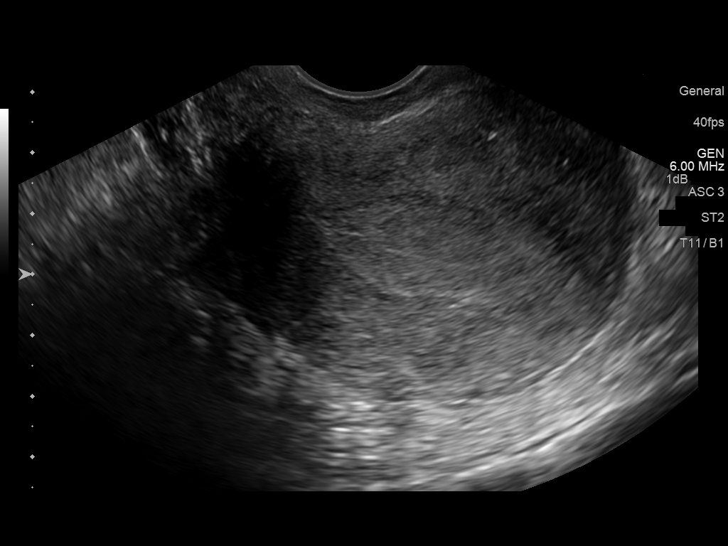

[13 of 25 positions shown; findings below may reference images not displayed]

FINDINGS: Uterus

Measurements: 8.1 x 5.5 x 5.8 cm. No fibroids or other mass
visualized.

Endometrium

Thickness: 15 mm.  No focal abnormality visualized.

Right ovary

Measurements: 2.8 x 2.1 x 2.4 cm. Normal appearance/no adnexal mass.

Left ovary

Measurements: 3.4 x 3.1 x 2.8 cm. 1.6 x 2 x 1.9 cm complex left
ovarian cystic mass without internal Doppler flow which may reflect
an endometrioma versus hemorrhagic cyst.

Pulsed Doppler evaluation of both ovaries demonstrates normal
low-resistance arterial and venous waveforms.

Other findings

Small amount of fluid adjacent to the right ovary.
IMPRESSION: 1. 1.6 x 2 x 1.9 cm complex left ovarian cystic mass without
internal Doppler flow which may reflect an endometrioma versus
hemorrhagic cyst.
2. No ovarian torsion.

## 2016-09-17 IMAGING — US US ART/VEN ABD/PELV/SCROTUM DOPPLER LTD
1 series · 13 of 25 positions shown · non-contrast
Comparison: 04/23/2015

CLINICAL DATA: Left pelvic pain. History of endometriosis. Last
menstrual period 04/25/2015

EXAM:
TRANSABDOMINAL AND TRANSVAGINAL ULTRASOUND OF PELVIS
DOPPLER ULTRASOUND OF OVARIES
TECHNIQUE: Both transabdominal and transvaginal ultrasound examinations of the
pelvis were performed. Transabdominal technique was performed for
global imaging of the pelvis including uterus, ovaries, adnexal
regions, and pelvic cul-de-sac.
It was necessary to proceed with endovaginal exam following the
transabdominal exam to visualize the endometrium and bilateral
ovaries. Color and duplex Doppler ultrasound was utilized to
evaluate blood flow to the ovaries.

[Series 1: us art/ven abd/pelv/scrotum doppler ltd · 0.21mm/px · 13 of 123 slices shown]
[im 1/123]
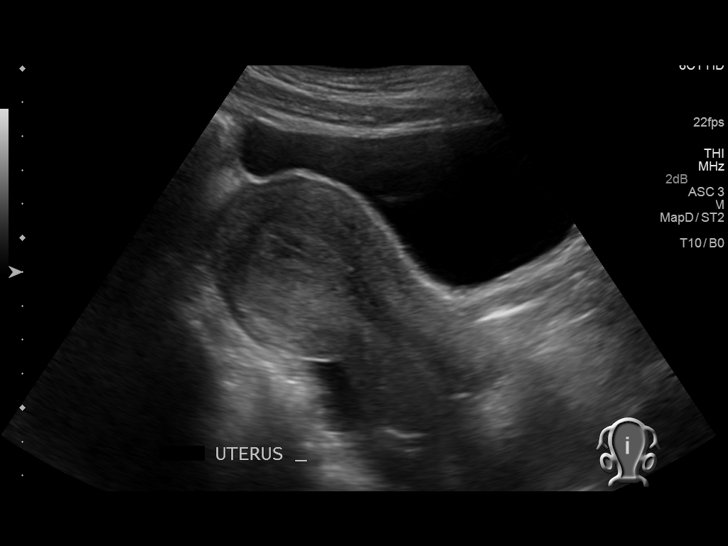
[im 11/123]
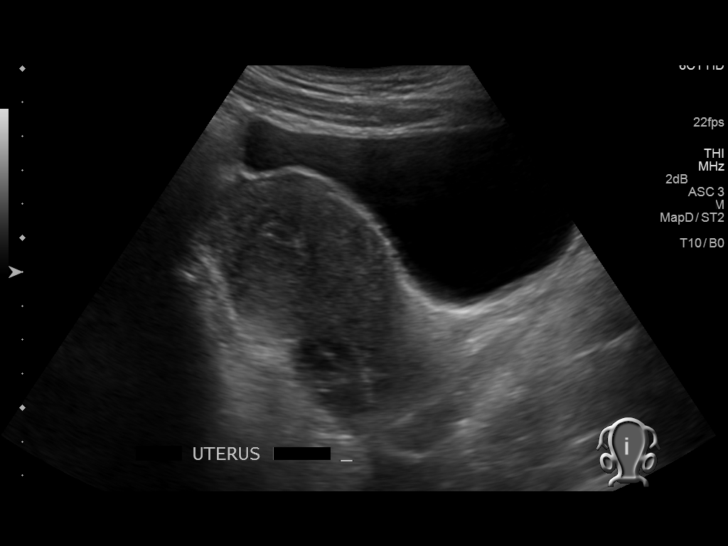
[im 21/123]
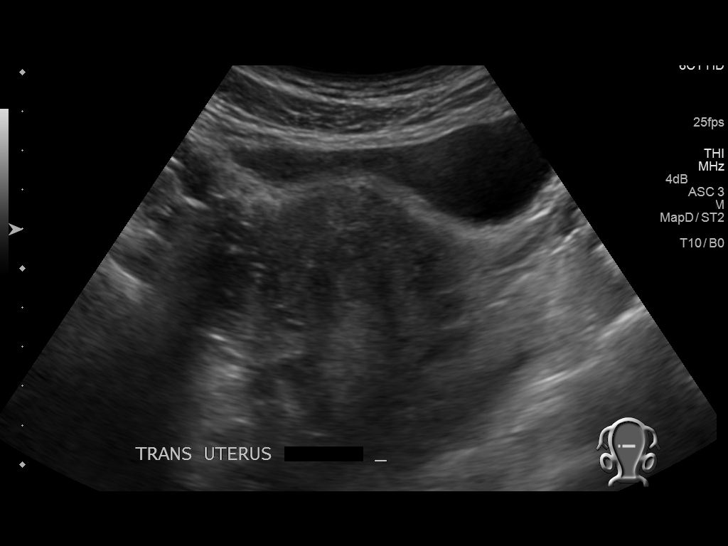
[im 31/123]
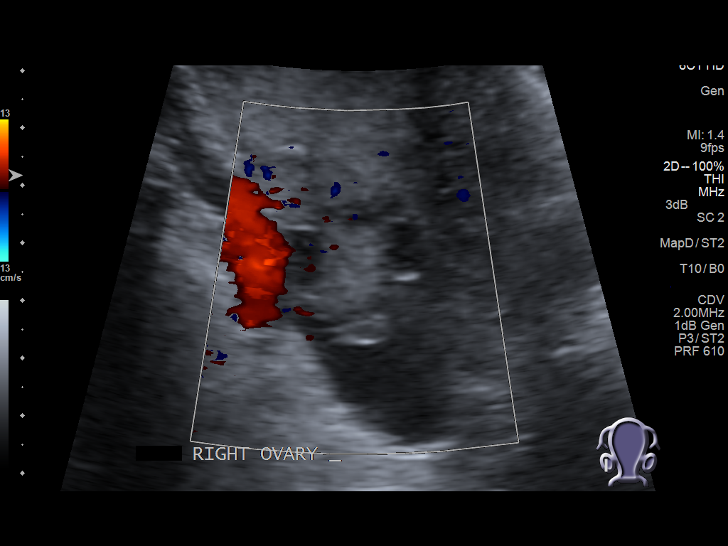
[im 41/123]
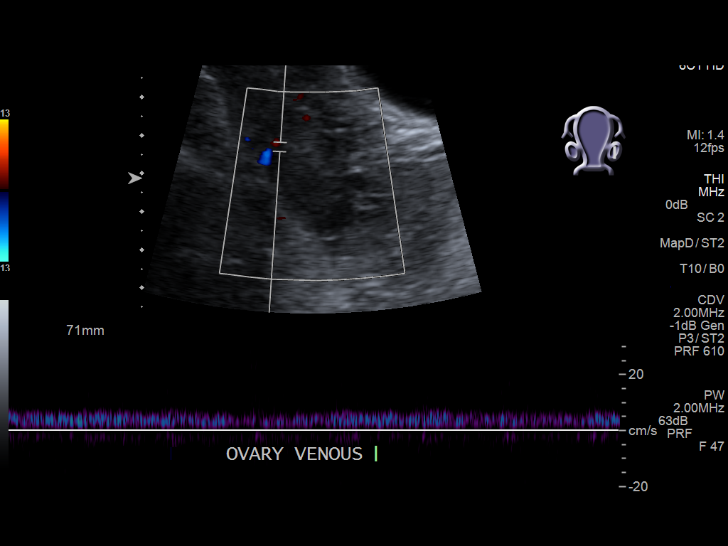
[im 51/123]
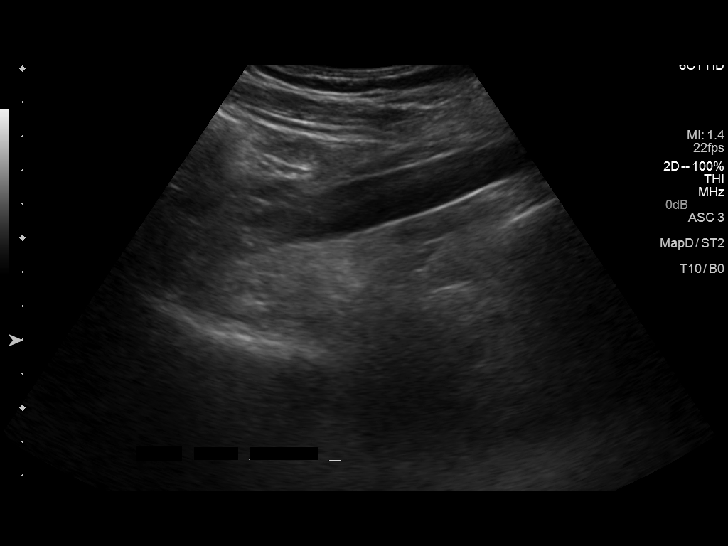
[im 62/123]
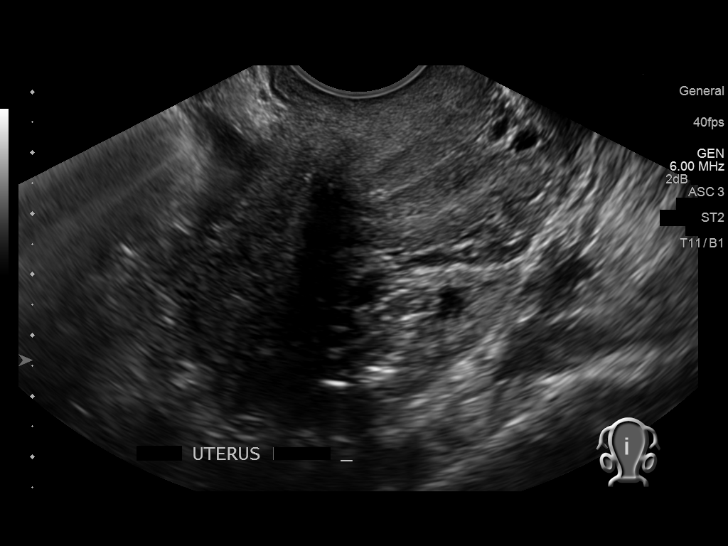
[im 72/123]
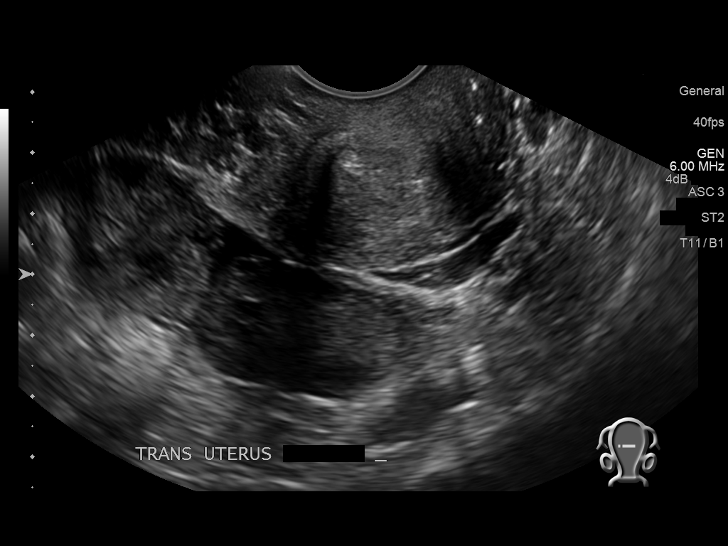
[im 82/123]
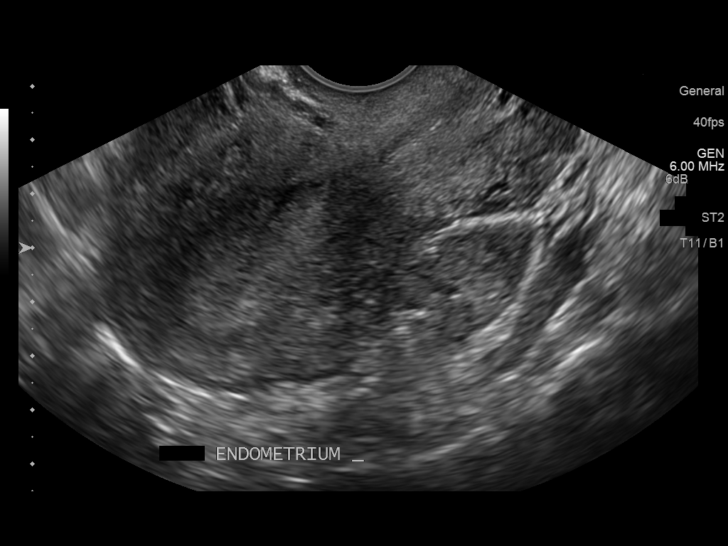
[im 92/123]
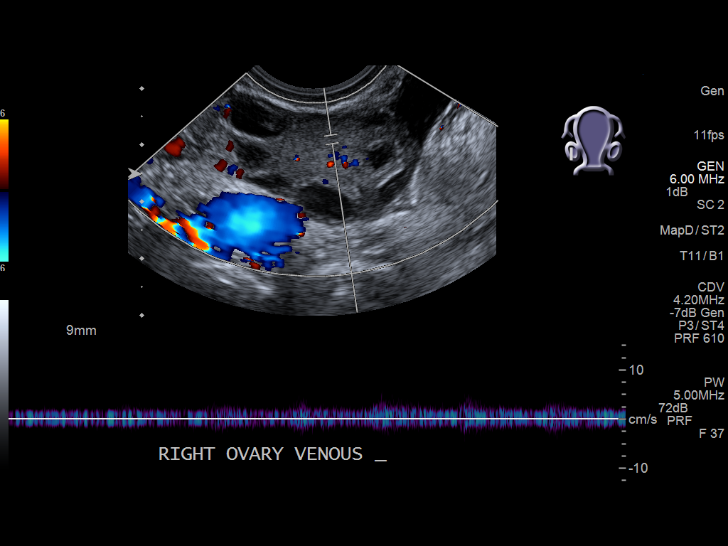
[im 102/123]
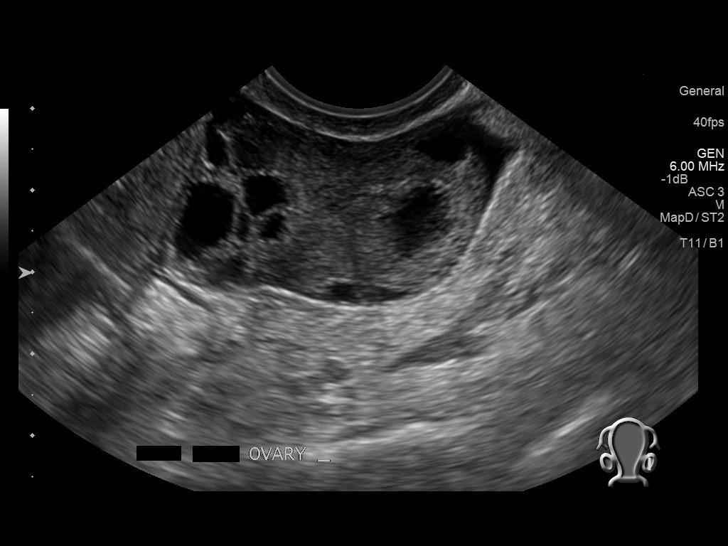
[im 112/123]
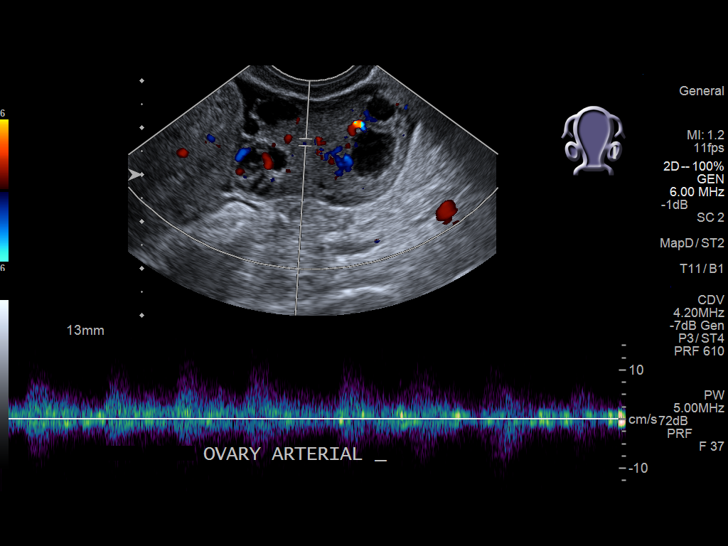
[im 123/123]
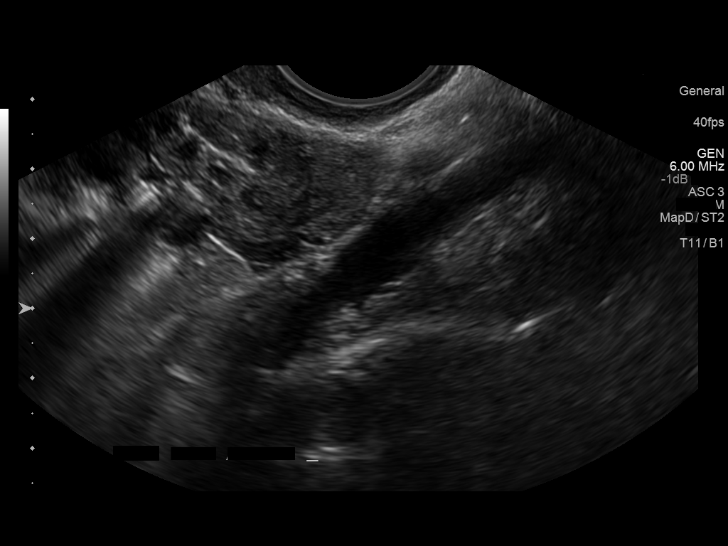

[13 of 25 positions shown; findings below may reference images not displayed]

FINDINGS: Uterus

Measurements: 9.3 x 5.2 x 5.3 cm. No fibroids or other mass
visualized.

Endometrium

Thickness: 1.4 cm.  No focal abnormality visualized.

Right ovary

Measurements: 3.3 by 2.3 by 3.3 cm. Normal appearance / no adnexal
mass.

Left ovary

Measurements: 3.8 by 2.3 by 3.8. The previously seen left ovarian
thick-walled cystic structure now measures 1.9 x 1.3 x 1.9 cm, which
represents a mild decrease in size. No intramural blood flow is
seen.

Pulsed Doppler evaluation of both ovaries demonstrates normal
low-resistance arterial and venous waveforms.

Other findings

There is a trace amount of free pelvic fluid.
IMPRESSION: Slightly decreased in size thick-walled cystic structure on the left
ovary, which may represent a degenerating cyst versus less likely an
endometrioma. Follow-up with a pelvic ultrasound in 6 months may be
considered if found clinically necessary.

Normal appearance of the right ovary and the uterus.

Normal blood supply with appropriate waveforms of flow velocity of
the ovaries.

## 2016-09-24 ENCOUNTER — Ambulatory Visit
Admission: RE | Admit: 2016-09-24 | Discharge: 2016-09-24 | Disposition: A | Discharge status: Home / Self Care | Payer: Commercial Managed Care - PPO | Source: Ambulatory Visit | Attending: Sports Medicine | Admitting: Sports Medicine

## 2016-09-24 DIAGNOSIS — M79641 Pain in right hand: Secondary | ICD-10-CM

## 2016-10-12 ENCOUNTER — Encounter (HOSPITAL_COMMUNITY): Payer: Self-pay

## 2016-10-12 ENCOUNTER — Emergency Department (HOSPITAL_COMMUNITY)
Admission: EM | Admit: 2016-10-12 | Discharge: 2016-10-12 | Disposition: A | Discharge status: Home / Self Care | Payer: Commercial Managed Care - PPO | Attending: Emergency Medicine | Admitting: Emergency Medicine

## 2016-10-12 DIAGNOSIS — R1031 Right lower quadrant pain: Secondary | ICD-10-CM

## 2016-10-12 DIAGNOSIS — Z87891 Personal history of nicotine dependence: Secondary | ICD-10-CM | POA: Insufficient documentation

## 2016-10-12 DIAGNOSIS — R102 Pelvic and perineal pain: Secondary | ICD-10-CM | POA: Diagnosis present

## 2016-10-12 LAB — CBC WITH DIFFERENTIAL/PLATELET
BASOS ABS: 0 10*3/uL (ref 0.0–0.1)
Basophils Relative: 0 %
Eosinophils Absolute: 0.1 10*3/uL (ref 0.0–0.7)
Eosinophils Relative: 1 %
HEMATOCRIT: 41.5 % (ref 36.0–46.0)
Hemoglobin: 13.5 g/dL (ref 12.0–15.0)
LYMPHS PCT: 29 %
Lymphs Abs: 3.1 10*3/uL (ref 0.7–4.0)
MCH: 28.3 pg (ref 26.0–34.0)
MCHC: 32.5 g/dL (ref 30.0–36.0)
MCV: 87 fL (ref 78.0–100.0)
MONO ABS: 0.4 10*3/uL (ref 0.1–1.0)
Monocytes Relative: 4 %
NEUTROS ABS: 7.1 10*3/uL (ref 1.7–7.7)
Neutrophils Relative %: 66 %
Platelets: 284 10*3/uL (ref 150–400)
RBC: 4.77 MIL/uL (ref 3.87–5.11)
RDW: 15 % (ref 11.5–15.5)
WBC: 10.7 10*3/uL — ABNORMAL HIGH (ref 4.0–10.5)

## 2016-10-12 LAB — URINALYSIS, ROUTINE W REFLEX MICROSCOPIC
Bilirubin Urine: NEGATIVE
GLUCOSE, UA: NEGATIVE mg/dL
Hgb urine dipstick: NEGATIVE
Ketones, ur: NEGATIVE mg/dL
LEUKOCYTES UA: NEGATIVE
Nitrite: NEGATIVE
PROTEIN: NEGATIVE mg/dL
SPECIFIC GRAVITY, URINE: 1.017 (ref 1.005–1.030)
pH: 6 (ref 5.0–8.0)

## 2016-10-12 LAB — COMPREHENSIVE METABOLIC PANEL
ALK PHOS: 67 U/L (ref 38–126)
ALT: 16 U/L (ref 14–54)
AST: 17 U/L (ref 15–41)
Albumin: 3.5 g/dL (ref 3.5–5.0)
Anion gap: 10 (ref 5–15)
BILIRUBIN TOTAL: 0.3 mg/dL (ref 0.3–1.2)
BUN: 5 mg/dL — AB (ref 6–20)
CO2: 22 mmol/L (ref 22–32)
CREATININE: 0.67 mg/dL (ref 0.44–1.00)
Calcium: 9.2 mg/dL (ref 8.9–10.3)
Chloride: 104 mmol/L (ref 101–111)
GFR calc Af Amer: >60 mL/min (ref >60)
Glucose, Bld: 79 mg/dL (ref 65–99)
Potassium: 3.6 mmol/L (ref 3.5–5.1)
Sodium: 136 mmol/L (ref 135–145)
TOTAL PROTEIN: 7.1 g/dL (ref 6.5–8.1)

## 2016-10-12 LAB — POC URINE PREG, ED: PREG TEST UR: NEGATIVE

## 2016-10-12 LAB — LIPASE, BLOOD: Lipase: 15 U/L (ref 11–51)

## 2016-10-12 LAB — I-STAT CG4 LACTIC ACID, ED: LACTIC ACID, VENOUS: 0.67 mmol/L (ref 0.5–1.9)

## 2016-10-12 MED ORDER — OXYCODONE-ACETAMINOPHEN 5-325 MG PO TABS: 1.0000 | ORAL_TABLET | Freq: Four times a day (QID) | ORAL | 0 refills | Status: DC | PRN

## 2016-10-12 MED ORDER — ONDANSETRON HCL 4 MG PO TABS
4.0000 mg | ORAL_TABLET | Freq: Once | ORAL | Status: AC
  Administered 2016-10-12: 4 mg via ORAL
  Filled 2016-10-12: qty 1

## 2016-10-12 MED ORDER — ONDANSETRON HCL 4 MG/2ML IJ SOLN
4.0000 mg | Freq: Once | INTRAMUSCULAR | Status: AC
  Administered 2016-10-12: 4 mg via INTRAVENOUS
  Filled 2016-10-12: qty 2

## 2016-10-12 MED ORDER — ONDANSETRON HCL 4 MG PO TABS: 4.0000 mg | ORAL_TABLET | Freq: Three times a day (TID) | ORAL | 0 refills | Status: DC | PRN

## 2016-10-12 MED ORDER — HYDROMORPHONE HCL 2 MG/ML IJ SOLN
1.0000 mg | Freq: Once | INTRAMUSCULAR | Status: AC
  Administered 2016-10-12: 1 mg via INTRAVENOUS
  Filled 2016-10-12: qty 1

## 2016-10-12 MED ORDER — SODIUM CHLORIDE 0.9 % IV BOLUS (SEPSIS)
1000.0000 mL | Freq: Once | INTRAVENOUS | Status: AC
  Administered 2016-10-12: 1000 mL via INTRAVENOUS

## 2016-10-12 MED ORDER — OXYCODONE-ACETAMINOPHEN 5-325 MG PO TABS
1.0000 | ORAL_TABLET | Freq: Once | ORAL | Status: AC
Start: 2016-10-12 — End: 2016-10-12
  Administered 2016-10-12: 1 via ORAL
  Filled 2016-10-12: qty 1

## 2016-10-12 NOTE — ED Notes (Signed)
ED Provider at bedside. 

## 2016-10-12 NOTE — ED Provider Notes (Signed)
[facility]-EMERGENCY DEPT Provider Note   CSN: [id number] Arrival date & time: 10/12/16  1123  By signing my name below, I, Krystal Reeves, attest that this documentation has been prepared under the direction and in the presence of Heide Scales, MD. Electronically Signed: Soijett Reeves, ED Scribe. 10/12/16. 12:22 PM.  History   Chief Complaint Chief Complaint  Patient presents with  . Pelvic Pain    HPI Krystal Reeves is a 31 y.o. female with a PMHx of endometriosis, who presents to the Emergency Department complaining of 8.5/10, stabbing, right lower, pelvic pain onset last night. Pt states that she used to be on a lupron injections for her endometriosis. Pt reports that her current pain is similar to her past hx of endometriosis. Pt is having associated symptoms of nausea, decreased appetite, resolved vomiting, resolved diarrhea, and resolved fever of 101 x 3 days. Pt notes that her last bowel movement was this morning. She has tried naprosyn Rx with no relief of her symptoms. She denies chills, vaginal discharge, vaginal bleeding, dysuria, constipation, blood in stool, cough, rhinorrhea, leg swelling, rash, and any other symptoms. Denies recent trauma.    The history is provided by the patient. No language interpreter was used.    Past Medical History:  Diagnosis Date  . Endometriosis   . Headache     There are no active problems to display for this patient.   Past Surgical History:  Procedure Laterality Date  . APPENDECTOMY    . DIAGNOSTIC LAPAROSCOPY    . LAPAROSCOPY N/A 07/22/2015   Procedure: LAPAROSCOPY DIAGNOSTIC;  Surgeon: Vena Austria, MD;  Location: [facility] ORS;  Service: Gynecology;  Laterality: N/A;  . TUBAL LIGATION Bilateral     OB History    Gravida Para Term Preterm AB Living   3 3 3          SAB TAB Ectopic Multiple Live Births                   Home Medications    Prior to Admission medications   Medication Sig Start Date End Date Taking?  Authorizing Provider  escitalopram (LEXAPRO) 20 MG tablet Take 20 mg by mouth daily.    Historical Provider, MD  HYDROcodone-acetaminophen (NORCO/VICODIN) 5-325 MG tablet Take 1 tablet by mouth every 6 (six) hours as needed for moderate pain. 08/19/16   Charlestine Night, PA-C  ibuprofen (ADVIL,MOTRIN) 800 MG tablet Take 1 tablet (800 mg total) by mouth every 8 (eight) hours as needed. 08/19/16   Kynsli Haapala Lawyer, PA-C  naproxen (NAPROSYN) 500 MG tablet TK 1 T PO BID WITH MEALS AS NEEDED FOR PAIN. 07/15/16   Historical Provider, MD  ondansetron (ZOFRAN ODT) 4 MG disintegrating tablet Take 1 tablet (4 mg total) by mouth every 8 (eight) hours as needed for nausea or vomiting. 06/15/16   Elpidio Anis, PA-C  oxyCODONE-acetaminophen (PERCOCET) 5-325 MG tablet Take 1 tablet by mouth every 4 (four) hours as needed for severe pain. 07/20/16   Mancel Bale, MD    Family History No family history on file.  Social History Social History  Substance Use Topics  . Smoking status: Former Smoker    Packs/day: 0.50    Years: 8.00    Types: Cigarettes  . Smokeless tobacco: Never Used  . Alcohol use Yes     Comment: occasionally     Allergies   Patient has no known allergies.   Review of Systems Review of Systems  Constitutional: Positive for appetite change and fever. Negative  for chills.  HENT: Negative for rhinorrhea.   Respiratory: Negative for cough.   Cardiovascular: Negative for leg swelling.  Gastrointestinal: Positive for diarrhea (resolved), nausea and vomiting (resolved). Negative for blood in stool and constipation.  Genitourinary: Positive for pelvic pain (right lower). Negative for dysuria, vaginal bleeding and vaginal discharge.  Skin: Negative for rash.     Physical Exam Updated Vital Signs BP 122/77   Pulse 84   Temp 99.1 F (37.3 C)   Resp 18   SpO2 98%   Physical Exam  Constitutional: She is oriented to person, place, and time. She appears well-developed and  well-nourished. No distress.  HENT:  Head: Normocephalic and atraumatic.  Right Ear: External ear normal.  Left Ear: External ear normal.  Nose: Nose normal.  Mouth/Throat: Oropharynx is clear and moist. No oropharyngeal exudate.  Eyes: Conjunctivae and EOM are normal. Pupils are equal, round, and reactive to light.  Neck: Normal range of motion. Neck supple.  Cardiovascular: Normal rate, regular rhythm and normal heart sounds.  Exam reveals no gallop and no friction rub.   No murmur heard. Pulmonary/Chest: Effort normal and breath sounds normal. No stridor. No respiratory distress. She has no wheezes. She has no rales.  Abdominal: She exhibits no distension. There is tenderness in the right lower quadrant. There is no rebound, no guarding and no CVA tenderness.  Musculoskeletal:  No hip tenderness.   Neurological: She is alert and oriented to person, place, and time. She has normal reflexes. She exhibits normal muscle tone. Coordination normal.  Skin: Skin is warm. No rash noted. She is not diaphoretic. No erythema.  Nursing note and vitals reviewed.    ED Treatments / Results  DIAGNOSTIC STUDIES: Oxygen Saturation is 98% on RA, nl by my interpretation.    COORDINATION OF CARE: 12:22 PM Discussed treatment plan with pt at bedside which includes UA, labs, IV fluids, dilaudid, zofran, percocet Rx, follow up with OB, and pt agreed to plan.  2:50 PM- Pt reassessed and symptoms are improving. Pt states that she would like to forego an abdominal US at this time and prefers to follow up with her OB.  Labs (all labs ordered are listed, but only abnormal results are displayed) Labs Reviewed  URINALYSIS, ROUTINE W REFLEX MICROSCOPIC - Abnormal; Notable for the following:       Result Value   APPearance HAZY (*)    All other components within normal limits  CBC WITH DIFFERENTIAL/PLATELET - Abnormal; Notable for the following:    WBC 10.7 (*)    All other components within normal limits    COMPREHENSIVE METABOLIC PANEL - Abnormal; Notable for the following:    BUN 5 (*)    All other components within normal limits  LIPASE, BLOOD  POC URINE PREG, ED  I-STAT CG4 LACTIC ACID, ED    Radiology No results found.  Procedures Procedures (including critical care time)  Medications Ordered in ED Medications  sodium chloride 0.9 % bolus 1,000 mL (0 mLs Intravenous Stopped 10/12/16 1609)  sodium chloride 0.9 % bolus 1,000 mL (0 mLs Intravenous Stopped 10/12/16 1609)  ondansetron (ZOFRAN) injection 4 mg (4 mg Intravenous Given 10/12/16 1237)  HYDROmorphone (DILAUDID) injection 1 mg (1 mg Intravenous Given 10/12/16 1239)  HYDROmorphone (DILAUDID) injection 1 mg (1 mg Intravenous Given 10/12/16 1342)  oxyCODONE-acetaminophen (PERCOCET/ROXICET) 5-325 MG per tablet 1 tablet (1 tablet Oral Given 10/12/16 1608)  ondansetron (ZOFRAN) tablet 4 mg (4 mg Oral Given 10/12/16 1609)     Initial  Impression / Assessment and Plan / ED Course  I have reviewed the triage vital signs and the nursing notes.  Pertinent labs & imaging results that were available during my care of the patient were reviewed by me and considered in my medical decision making (see chart for details).  Clinical Course     Krystal Reeves is a 31 y.o. female with a PMHx of endometriosis, who presents to the Emergency Department complaining of right lower quadrant abdominal pain. Patient says this pain is "the same as my endometriosis pain". Patient says that she has a history of urinary tract infection but does not feel that it is the same. Patient reports some recent diarrhea and decreased oral intake given the discomfort and nausea.  History and exam are seen above. Patient had tenderness in her right lower quadrant. No tenderness in the other areas of her abdomen. No back tenderness or chest pain. No leg abnormality on exam.   As patient says this is very similar to prior episodes of endometriosis, will hold on imaging at  this time. Patient had a normal bowel movement earlier today, doubt obstruction. Patient does not have any CVA tenderness or urine complaints, doubt kidney stone or UTI however urinalysis will be sent. Patient given pain medicine, nausea medicine, and fluids as she is likely dehydrated from decreased intake and diarrhea. Patient has already had her appendix removed.  Anticipate reassessment following medications and lab testing.  1:39 PM Patient had temporary improvement with pain with Dilaudid. Symptoms returned and patient given a second dose to try to improve her pain.  Laboratory testing returned showing nonelevated lactic acid. Lipase not elevated. Pregnancy test negative. Mild leukocytosis of 10.7 but CBC otherwise unremarkable. CMP unremarkable.  Conversation was held with patient and she does not want to pursue imaging at this time as it feels "the same" as her prior episodes of pain. She does want to pursue oral medications including Percocet and nausea medicine. Patient will be given a dose of pain and nausea medicine. Anticipate discharge with prescriptions of these medications.  Patient felt much better after oral medications. Patient will follow up with PCP and GYN for further management. Patient had no other questions or concerns and understood management plan. Patient discharged in good condition.   Final Clinical Impressions(s) / ED Diagnoses   Final diagnoses:  Right lower quadrant abdominal pain    New Prescriptions Discharge Medication List as of 10/12/2016  5:03 PM    START taking these medications   Details  ondansetron (ZOFRAN) 4 MG tablet Take 1 tablet (4 mg total) by mouth every 8 (eight) hours as needed for nausea or vomiting., Starting Tue 10/12/2016, Print    !! oxyCODONE-acetaminophen (PERCOCET/ROXICET) 5-325 MG tablet Take 1 tablet by mouth every 6 (six) hours as needed for severe pain., Starting Tue 10/12/2016, Print     !! - Potential duplicate medications  found. Please discuss with provider.      I personally performed the services described in this documentation, which was scribed in my presence. The recorded information has been reviewed and is accurate.   Clinical Impression: 1. Right lower quadrant abdominal pain     Disposition: Discharge  Condition: Good  I have discussed the results, Dx and Tx plan with the pt(& family if present). He/she/they expressed understanding and agree(s) with the plan. Discharge instructions discussed at great length. Strict return precautions discussed and pt &/or family have verbalized understanding of the instructions. No further questions at time of discharge.  Discharge Medication List as of 10/12/2016  5:03 PM    START taking these medications   Details  ondansetron (ZOFRAN) 4 MG tablet Take 1 tablet (4 mg total) by mouth every 8 (eight) hours as needed for nausea or vomiting., Starting Tue 10/12/2016, Print    !! oxyCODONE-acetaminophen (PERCOCET/ROXICET) 5-325 MG tablet Take 1 tablet by mouth every 6 (six) hours as needed for severe pain., Starting Tue 10/12/2016, Print     !! - Potential duplicate medications found. Please discuss with provider.      Follow Up: [facility] [address] [place] [id number] [phone]    [facility] [address] [id number] [place] [phone]  If symptoms worsen       Heide Scaleshristopher J Jamaurion Slemmer, MD 10/12/16 [phone]

## 2016-10-12 NOTE — ED Notes (Addendum)
Pt.s was informed that children are not permitted back in the ED due to the influenza  Precautions.  She stated, He has to stay with me, I have noone to watch him."

## 2016-10-12 NOTE — Discharge Instructions (Signed)
Please schedule appointment to follow-up with her OB/GYN team. Please follow-up with your PCP for ongoing management of your abdominal pain. Please use the pain medicine and nausea medicine as needed to help with your discomfort. Please stay hydrated. There is no evidence of infection today. If any symptoms return or worsen, please return to the nearest emergency department.

## 2016-10-12 NOTE — ED Triage Notes (Signed)
Patient complains of ongoing pelvic pain x 1 day, pain worse with movement, hx of endometriosis.

## 2016-12-10 ENCOUNTER — Emergency Department (HOSPITAL_COMMUNITY): Payer: Commercial Managed Care - PPO

## 2016-12-10 ENCOUNTER — Inpatient Hospital Stay (HOSPITAL_COMMUNITY)
Admission: EM | Admit: 2016-12-10 | Discharge: 2016-12-13 | DRG: 355 | Disposition: A | Discharge status: Home / Self Care | Payer: Commercial Managed Care - PPO | Attending: Physician Assistant | Admitting: Physician Assistant

## 2016-12-10 ENCOUNTER — Encounter (HOSPITAL_COMMUNITY): Payer: Self-pay

## 2016-12-10 DIAGNOSIS — E669 Obesity, unspecified: Secondary | ICD-10-CM | POA: Diagnosis present

## 2016-12-10 DIAGNOSIS — K436 Other and unspecified ventral hernia with obstruction, without gangrene: Principal | ICD-10-CM | POA: Diagnosis present

## 2016-12-10 DIAGNOSIS — Z87891 Personal history of nicotine dependence: Secondary | ICD-10-CM

## 2016-12-10 DIAGNOSIS — Z79899 Other long term (current) drug therapy: Secondary | ICD-10-CM

## 2016-12-10 DIAGNOSIS — Z6835 Body mass index (BMI) 35.0-35.9, adult: Secondary | ICD-10-CM

## 2016-12-10 DIAGNOSIS — K439 Ventral hernia without obstruction or gangrene: Secondary | ICD-10-CM

## 2016-12-10 LAB — COMPREHENSIVE METABOLIC PANEL
ALBUMIN: 3.5 g/dL (ref 3.5–5.0)
ALK PHOS: 79 U/L (ref 38–126)
ALT: 27 U/L (ref 14–54)
ANION GAP: 9 (ref 5–15)
AST: 25 U/L (ref 15–41)
CO2: 21 mmol/L — ABNORMAL LOW (ref 22–32)
CREATININE: 0.74 mg/dL (ref 0.44–1.00)
Calcium: 9.1 mg/dL (ref 8.9–10.3)
Chloride: 108 mmol/L (ref 101–111)
GFR calc Af Amer: >60 mL/min (ref >60)
GFR calc non Af Amer: >60 mL/min (ref >60)
GLUCOSE: 114 mg/dL — AB (ref 65–99)
POTASSIUM: 3.3 mmol/L — AB (ref 3.5–5.1)
Sodium: 138 mmol/L (ref 135–145)
Total Bilirubin: 0.6 mg/dL (ref 0.3–1.2)
Total Protein: 7.4 g/dL (ref 6.5–8.1)

## 2016-12-10 LAB — CBC WITH DIFFERENTIAL/PLATELET
Basophils Absolute: 0 10*3/uL (ref 0.0–0.1)
Basophils Relative: 0 %
EOS ABS: 0.1 10*3/uL (ref 0.0–0.7)
EOS PCT: 1 %
HCT: 41.9 % (ref 36.0–46.0)
Hemoglobin: 13.7 g/dL (ref 12.0–15.0)
LYMPHS ABS: 2.2 10*3/uL (ref 0.7–4.0)
LYMPHS PCT: 21 %
MCH: 28.6 pg (ref 26.0–34.0)
MCHC: 32.7 g/dL (ref 30.0–36.0)
MCV: 87.5 fL (ref 78.0–100.0)
MONO ABS: 0.5 10*3/uL (ref 0.1–1.0)
MONOS PCT: 5 %
Neutro Abs: 7.7 10*3/uL (ref 1.7–7.7)
Neutrophils Relative %: 73 %
PLATELETS: 299 10*3/uL (ref 150–400)
RBC: 4.79 MIL/uL (ref 3.87–5.11)
RDW: 15 % (ref 11.5–15.5)
WBC: 10.5 10*3/uL (ref 4.0–10.5)

## 2016-12-10 LAB — I-STAT BETA HCG BLOOD, ED (MC, WL, AP ONLY)

## 2016-12-10 LAB — SURGICAL PCR SCREEN
MRSA, PCR: NEGATIVE
Staphylococcus aureus: NEGATIVE

## 2016-12-10 MED ORDER — MORPHINE SULFATE (PF) 4 MG/ML IV SOLN
2.0000 mg | INTRAVENOUS | Status: DC | PRN
  Administered 2016-12-10 – 2016-12-11 (×4): 2 mg via INTRAVENOUS
  Filled 2016-12-10 (×4): qty 1

## 2016-12-10 MED ORDER — ACETAMINOPHEN 325 MG PO TABS
650.0000 mg | ORAL_TABLET | Freq: Four times a day (QID) | ORAL | Status: DC | PRN
  Administered 2016-12-12: 650 mg via ORAL
  Filled 2016-12-10 (×2): qty 2

## 2016-12-10 MED ORDER — FENTANYL CITRATE (PF) 100 MCG/2ML IJ SOLN
50.0000 ug | Freq: Once | INTRAMUSCULAR | Status: AC
  Administered 2016-12-10: 50 ug via INTRAVENOUS
  Filled 2016-12-10: qty 2

## 2016-12-10 MED ORDER — DIPHENHYDRAMINE HCL 50 MG/ML IJ SOLN
25.0000 mg | Freq: Four times a day (QID) | INTRAMUSCULAR | Status: DC | PRN
  Administered 2016-12-10: 25 mg via INTRAVENOUS
  Filled 2016-12-10: qty 1

## 2016-12-10 MED ORDER — ONDANSETRON HCL 4 MG/2ML IJ SOLN
4.0000 mg | Freq: Four times a day (QID) | INTRAMUSCULAR | Status: DC | PRN
  Administered 2016-12-11: 4 mg via INTRAVENOUS
  Filled 2016-12-10: qty 2

## 2016-12-10 MED ORDER — SODIUM CHLORIDE 0.9 % IV SOLN
INTRAVENOUS | Status: DC
  Administered 2016-12-10 – 2016-12-11 (×3): via INTRAVENOUS

## 2016-12-10 MED ORDER — DIPHENHYDRAMINE HCL 25 MG PO CAPS: 25.0000 mg | ORAL_CAPSULE | Freq: Four times a day (QID) | ORAL | Status: DC | PRN

## 2016-12-10 MED ORDER — ONDANSETRON HCL 4 MG/2ML IJ SOLN
4.0000 mg | Freq: Once | INTRAMUSCULAR | Status: AC
  Administered 2016-12-10: 4 mg via INTRAVENOUS
  Filled 2016-12-10: qty 2

## 2016-12-10 MED ORDER — OXYCODONE HCL 5 MG PO TABS
5.0000 mg | ORAL_TABLET | ORAL | Status: DC | PRN
  Administered 2016-12-10 – 2016-12-13 (×6): 10 mg via ORAL
  Filled 2016-12-10 (×7): qty 2

## 2016-12-10 MED ORDER — CEFAZOLIN SODIUM-DEXTROSE 2-4 GM/100ML-% IV SOLN
2.0000 g | INTRAVENOUS | Status: DC
  Filled 2016-12-10 (×2): qty 100

## 2016-12-10 MED ORDER — ACETAMINOPHEN 650 MG RE SUPP: 650.0000 mg | Freq: Four times a day (QID) | RECTAL | Status: DC | PRN

## 2016-12-10 MED ORDER — IOPAMIDOL (ISOVUE-300) INJECTION 61%
INTRAVENOUS | Status: AC
  Administered 2016-12-10: 100 mL
  Filled 2016-12-10: qty 100

## 2016-12-10 MED ORDER — ONDANSETRON 4 MG PO TBDP: 4.0000 mg | ORAL_TABLET | Freq: Four times a day (QID) | ORAL | Status: DC | PRN

## 2016-12-10 NOTE — ED Notes (Signed)
Taken to CT.

## 2016-12-10 NOTE — Anesthesia Preprocedure Evaluation (Addendum)
Anesthesia Evaluation  Patient identified by MRN, date of birth, ID band  Reviewed: Allergy & Precautions, NPO status , Patient's Chart, lab work & pertinent test results  History of Anesthesia Complications Negative for: history of anesthetic complications  Airway Mallampati: I  TM Distance: >3 FB Neck ROM: Full    Dental  (+) Teeth Intact, Dental Advisory Given   Pulmonary neg pulmonary ROS, Current Smoker, former smoker,    breath sounds clear to auscultation       Cardiovascular Exercise Tolerance: Good negative cardio ROS   Rhythm:Regular Rate:Normal     Neuro/Psych  Headaches, negative psych ROS   GI/Hepatic negative GI ROS, Neg liver ROS,   Endo/Other  Morbid obesity  Renal/GU negative Renal ROS  negative genitourinary   Musculoskeletal negative musculoskeletal ROS (+)   Abdominal (+) + obese,  Abdomen: soft. Bowel sounds: normal.  Peds negative pediatric ROS (+)  Hematology negative hematology ROS (+)   Anesthesia Other Findings   Reproductive/Obstetrics negative OB ROS                           BP Readings from Last 3 Encounters:  12/11/16 121/83  10/12/16 114/87  08/19/16 119/72   Lab Results  Component Value Date   WBC 10.4 12/11/2016   HGB 13.5 12/11/2016   HCT 41.5 12/11/2016   MCV 88.7 12/11/2016   PLT 257 12/11/2016     Anesthesia Physical  Anesthesia Plan  ASA: II  Anesthesia Plan: General   Post-op Pain Management:    Induction: Intravenous  Airway Management Planned: Oral ETT  Additional Equipment:   Intra-op Plan:   Post-operative Plan: Extubation in OR  Informed Consent: I have reviewed the patients History and Physical, chart, labs and discussed the procedure including the risks, benefits and alternatives for the proposed anesthesia with the patient or authorized representative who has indicated his/her understanding and acceptance.    Dental advisory given  Plan Discussed with: CRNA, Anesthesiologist and Surgeon  Anesthesia Plan Comments:        Anesthesia Quick Evaluation

## 2016-12-10 NOTE — ED Provider Notes (Signed)
[facility] DEPT Provider Note   CSN: [id number] Arrival date & time: 12/10/16  1013     History   Chief Complaint Chief Complaint  Patient presents with  . Abdominal Pain    HPI Krystal Reeves is a 31 y.o. female.  HPI   Patient is a 31 year old female presenting with she will hernia. Patient presenting because patient had diasti recti but has noticed that something is poking through. She is unable to push it back in. She has a lot of pain when pressing on it. Patient's noticed no vomiting. No nausea. No fevers. Patient's been lifting her godson more than usual.   Past Medical History:  Diagnosis Date  . Endometriosis   . Headache     Patient Active Problem List   Diagnosis Date Noted  . Incarcerated ventral hernia 12/10/2016    Past Surgical History:  Procedure Laterality Date  . APPENDECTOMY    . DIAGNOSTIC LAPAROSCOPY    . LAPAROSCOPY N/A 07/22/2015   Procedure: LAPAROSCOPY DIAGNOSTIC;  Surgeon: Vena Austria, MD;  Location: [facility] ORS;  Service: Gynecology;  Laterality: N/A;  . TUBAL LIGATION Bilateral     OB History    Gravida Para Term Preterm AB Living   3 3 3          SAB TAB Ectopic Multiple Live Births                   Home Medications    Prior to Admission medications   Medication Sig Start Date End Date Taking? Authorizing Provider  escitalopram (LEXAPRO) 20 MG tablet Take 20 mg by mouth daily.   Yes Historical Provider, MD    Family History No family history on file.  Social History Social History  Substance Use Topics  . Smoking status: Former Smoker    Packs/day: 0.50    Years: 8.00    Types: Cigarettes  . Smokeless tobacco: Never Used  . Alcohol use Yes     Comment: occasionally     Allergies   Patient has no known allergies.   Review of Systems Review of Systems  Constitutional: Negative for activity change, fatigue and fever.  Respiratory: Negative for shortness of breath.   Cardiovascular: Negative for chest pain.   Gastrointestinal: Positive for abdominal pain. Negative for nausea and vomiting.  All other systems reviewed and are negative.    Physical Exam Updated Vital Signs BP 118/72 (BP Location: Left Arm)   Pulse 77   Temp 97.9 F (36.6 C) (Oral)   Resp 19   Ht 5\' 6"  (1.676 m)   Wt 220 lb (99.8 kg)   SpO2 98%   BMI 35.51 kg/m   Physical Exam  Constitutional: She is oriented to person, place, and time. She appears well-developed and well-nourished.  HENT:  Head: Normocephalic and atraumatic.  Eyes: Right eye exhibits no discharge.  Cardiovascular: Normal rate, regular rhythm and normal heart sounds.   No murmur heard. Pulmonary/Chest: Effort normal and breath sounds normal. She has no wheezes. She has no rales.  Abdominal: Soft. She exhibits no distension. There is tenderness.  Hernia, non reducible, ventral ,  NO skin changes  Neurological: She is oriented to person, place, and time.  Skin: Skin is warm and dry. She is not diaphoretic.  Psychiatric: She has a normal mood and affect.  Nursing note and vitals reviewed.    ED Treatments / Results  Labs (all labs ordered are listed, but only abnormal results are displayed) Labs Reviewed  COMPREHENSIVE METABOLIC  PANEL - Abnormal; Notable for the following:       Result Value   Potassium 3.3 (*)    CO2 21 (*)    Glucose, Bld 114 (*)    BUN <5 (*)    All other components within normal limits  SURGICAL PCR SCREEN  CBC WITH DIFFERENTIAL/PLATELET  HIV ANTIBODY (ROUTINE TESTING)  BASIC METABOLIC PANEL  CBC  I-STAT BETA HCG BLOOD, ED ([facility], [facility], [facility] ONLY)    EKG  EKG Interpretation None       Radiology Ct Abdomen Pelvis W Contrast  Result Date: 12/10/2016 CLINICAL DATA:  Ventral hernia EXAM: CT ABDOMEN AND PELVIS WITH CONTRAST TECHNIQUE: Multidetector CT imaging of the abdomen and pelvis was performed using the standard protocol following bolus administration of intravenous contrast. CONTRAST:  ISOVUE-300 IOPAMIDOL  (ISOVUE-300) INJECTION 61% COMPARISON:  None. FINDINGS: Lower chest: No acute abnormality. Hepatobiliary: No focal liver abnormality is seen. No gallstones, gallbladder wall thickening, or biliary dilatation. Pancreas: Unremarkable. No pancreatic ductal dilatation or surrounding inflammatory changes. Spleen: Normal in size without focal abnormality. Adrenals/Urinary Tract: Adrenal glands are unremarkable. Kidneys are normal, without renal calculi, focal lesion, or hydronephrosis. Bladder is unremarkable. Stomach/Bowel: Stomach and small bowel decompressed. Surgical clips at the base of the cecum. Appendix surgically absent. The colon is nondilated. No focal inflammatory process or wall thickening evident. Vascular/Lymphatic: No significant vascular findings are present. No enlarged abdominal or pelvic lymph nodes. Reproductive: Uterus and bilateral adnexa are unremarkable. Bilateral tubal ligation clips. Other: No ascites.  No free air. Musculoskeletal: Small ventral hernia containing only mesenteric fat. Mild inflammatory/edematous changes around a segment of the fat centrally within the hernia. Negative for fracture or other worrisome bone lesion. IMPRESSION: 1. Small supraumbilical ventral hernia, with edema/ Inflammation around a portion of involved mesenteric fat. 2. No acute intraabdominal pathology. Electronically Signed   By: Corlis Leak M.D.   On: 12/10/2016 13:53    Procedures Procedures (including critical care time)  Medications Ordered in ED Medications  0.9 %  sodium chloride infusion ( Intravenous New Bag/Given 12/10/16 1800)  acetaminophen (TYLENOL) tablet 650 mg (not administered)    Or  acetaminophen (TYLENOL) suppository 650 mg (not administered)  oxyCODONE (Oxy IR/ROXICODONE) immediate release tablet 5-10 mg (10 mg Oral Given 12/10/16 1936)  morphine 4 MG/ML injection 2 mg (2 mg Intravenous Given 12/10/16 1737)  diphenhydrAMINE (BENADRYL) capsule 25 mg (not administered)    Or    diphenhydrAMINE (BENADRYL) injection 25 mg (not administered)  ondansetron (ZOFRAN-ODT) disintegrating tablet 4 mg (not administered)    Or  ondansetron (ZOFRAN) injection 4 mg (not administered)  ceFAZolin (ANCEF) IVPB 2g/100 mL premix (not administered)  iopamidol (ISOVUE-300) 61 % injection (100 mLs  Contrast Given 12/10/16 1322)  fentaNYL (SUBLIMAZE) injection 50 mcg (50 mcg Intravenous Given 12/10/16 1417)  ondansetron (ZOFRAN) injection 4 mg (4 mg Intravenous Given 12/10/16 1417)     Initial Impression / Assessment and Plan / ED Course  I have reviewed the triage vital signs and the nursing notes.  Pertinent labs & imaging results that were available during my care of the patient were reviewed by me and considered in my medical decision making (see chart for details).    Patient is a 31 year old female presenting with ventral hernia. Unable to reduce on exam. However this is mostly because we are unable to give patient any pain medication because she is watching her 5 children that accompanied her on this visit. We will do CT to make sure there  is no strangulation versus incarceration. Patient has no skin changes overlying.  Pt with ventral strangulated hernia on CT. Will have surgery see patient.  They will admit.   Final Clinical Impressions(s) / ED Diagnoses   Final diagnoses:  Ventral hernia without obstruction or gangrene    New Prescriptions Current Discharge Medication List       Jilleen Essner Randall AnLyn Kenai Fluegel, MD 12/10/16 2147

## 2016-12-10 NOTE — H&P (Signed)
[facility] Consult/Admission Note  Krystal Reeves 25-Jan-1986  [id number].    Requesting MD: Dr. Thomasene Lot Chief Complaint/Reason for Consult: Fat containing ventral hernia  HPI:  Pt is a 31 year old obese female with a history of endometriosis, laparoscopic appendectomy, tubial ligation, laparoscopy for endometriosis, who presented to the ED with complaints of abdominal pain onset yesterday. Pt has not had this before or any history of hernias. Pt states she picked up her Rosalee Kaufman when she felt a sharp pain just above her umbilicus. The pain is constant, severe, nonradiating, being still makes it better, movement or touching it makes it worse. Associated nausea. No vomiting, fever, chills, diarrhea, dysuria, hematuria, CP, SOB.  ED Course: potassium 3.3. Labs unremarkable CT abd/pel: Small supraumbilical ventral hernia, with edema/ Inflammation around a portion of involved mesenteric fat  ROS:  Review of Systems  Constitutional: Negative for chills, diaphoresis and fever.  HENT: Negative for congestion and sore throat.   Eyes: Negative for pain and discharge.  Respiratory: Negative for cough and shortness of breath.   Cardiovascular: Negative for chest pain and leg swelling.  Gastrointestinal: Positive for abdominal pain and nausea. Negative for blood in stool, constipation, diarrhea and vomiting.  Genitourinary: Negative for dysuria and hematuria.  Skin: Negative for itching and rash.  Neurological: Negative for dizziness, loss of consciousness and headaches.  All other systems reviewed and are negative.    No family history on file.  Past Medical History:  Diagnosis Date  . Endometriosis   . Headache     Past Surgical History:  Procedure Laterality Date  . APPENDECTOMY    . DIAGNOSTIC LAPAROSCOPY    . LAPAROSCOPY N/A 07/22/2015   Procedure: LAPAROSCOPY DIAGNOSTIC;  Surgeon: Malachy Mood, MD;  Location: [facility] ORS;  Service: Gynecology;  Laterality: N/A;  . TUBAL  LIGATION Bilateral     Social History:  reports that she has quit smoking. Her smoking use included Cigarettes. She has a 4.00 pack-year smoking history. She has never used smokeless tobacco. She reports that she drinks alcohol. She reports that she does not use drugs.  Allergies: No Known Allergies   (Not in a hospital admission)  Blood pressure 125/87, pulse 82, temperature 98.2 F (36.8 C), temperature source Oral, resp. rate 17, height 5' 6"  (1.676 m), weight 220 lb (99.8 kg), SpO2 100 %.  Physical Exam  Constitutional: She is oriented to person, place, and time and well-developed, well-nourished, and in no distress. Vital signs are normal. No distress.  HENT:  Head: Normocephalic and atraumatic.  Nose: Nose normal.  Eyes: Conjunctivae are normal. Right eye exhibits no discharge. Left eye exhibits no discharge. No scleral icterus.  Neck: Normal range of motion. Neck supple.  Cardiovascular: Normal rate, regular rhythm, normal heart sounds and intact distal pulses.  Exam reveals no gallop and no friction rub.   No murmur heard. Pulses:      Radial pulses are 2+ on the right side, and 2+ on the left side.       Dorsalis pedis pulses are 2+ on the right side, and 2+ on the left side.  Pulmonary/Chest: Effort normal and breath sounds normal. No respiratory distress. She has no decreased breath sounds. She has no wheezes. She has no rhonchi. She has no rales.  Abdominal: Soft. Bowel sounds are normal. She exhibits no distension and no mass. There is tenderness in the periumbilical area. There is no rigidity, no rebound and no guarding.    Well healed scar noted to inferior  aspect of umbilicus. Small irregular mass noted just superior to umbilicus that is firm and tender, nonreducible  Musculoskeletal: Normal range of motion. She exhibits no edema or deformity.  Neurological: She is alert and oriented to person, place, and time.  Skin: Skin is warm and dry. No rash noted. She is not  diaphoretic.  Psychiatric: Mood and affect normal.  Nursing note and vitals reviewed.   Results for orders placed or performed during the hospital encounter of 12/10/16 (from the past 48 hour(s))  Comprehensive metabolic panel     Status: Abnormal   Collection Time: 12/10/16 10:39 AM  Result Value Ref Range   Sodium 138 135 - 145 mmol/L   Potassium 3.3 (L) 3.5 - 5.1 mmol/L   Chloride 108 101 - 111 mmol/L   CO2 21 (L) 22 - 32 mmol/L   Glucose, Bld 114 (H) 65 - 99 mg/dL   BUN <5 (L) 6 - 20 mg/dL   Creatinine, Ser 0.74 0.44 - 1.00 mg/dL   Calcium 9.1 8.9 - 10.3 mg/dL   Total Protein 7.4 6.5 - 8.1 g/dL   Albumin 3.5 3.5 - 5.0 g/dL   AST 25 15 - 41 U/L   ALT 27 14 - 54 U/L   Alkaline Phosphatase 79 38 - 126 U/L   Total Bilirubin 0.6 0.3 - 1.2 mg/dL   GFR calc non Af Amer >60 >60 mL/min   GFR calc Af Amer >60 >60 mL/min    Comment: (NOTE) The eGFR has been calculated using the CKD EPI equation. This calculation has not been validated in all clinical situations. eGFR's persistently <60 mL/min signify possible Chronic Kidney Disease.    Anion gap 9 5 - 15  CBC with Differential/Platelet     Status: None   Collection Time: 12/10/16 10:39 AM  Result Value Ref Range   WBC 10.5 4.0 - 10.5 K/uL   RBC 4.79 3.87 - 5.11 MIL/uL   Hemoglobin 13.7 12.0 - 15.0 g/dL   HCT 41.9 36.0 - 46.0 %   MCV 87.5 78.0 - 100.0 fL   MCH 28.6 26.0 - 34.0 pg   MCHC 32.7 30.0 - 36.0 g/dL   RDW 15.0 11.5 - 15.5 %   Platelets 299 150 - 400 K/uL   Neutrophils Relative % 73 %   Neutro Abs 7.7 1.7 - 7.7 K/uL   Lymphocytes Relative 21 %   Lymphs Abs 2.2 0.7 - 4.0 K/uL   Monocytes Relative 5 %   Monocytes Absolute 0.5 0.1 - 1.0 K/uL   Eosinophils Relative 1 %   Eosinophils Absolute 0.1 0.0 - 0.7 K/uL   Basophils Relative 0 %   Basophils Absolute 0.0 0.0 - 0.1 K/uL  I-Stat Beta hCG blood, ED ([facility], [facility], [facility] only)     Status: None   Collection Time: 12/10/16 12:26 PM  Result Value Ref Range   I-stat hCG,  quantitative <5.0 <5 mIU/mL   Comment 3            Comment:   GEST. AGE      CONC.  (mIU/mL)   <=1 WEEK        5 - 50     2 WEEKS       50 - 500     3 WEEKS       100 - 10,000     4 WEEKS     1,000 - 30,000        FEMALE AND NON-PREGNANT FEMALE:     LESS  THAN 5 mIU/mL    Ct Abdomen Pelvis W Contrast  Result Date: 12/10/2016 CLINICAL DATA:  Ventral hernia EXAM: CT ABDOMEN AND PELVIS WITH CONTRAST TECHNIQUE: Multidetector CT imaging of the abdomen and pelvis was performed using the standard protocol following bolus administration of intravenous contrast. CONTRAST:  194m ISOVUE-300 IOPAMIDOL (ISOVUE-300) INJECTION 61% COMPARISON:  None. FINDINGS: Lower chest: No acute abnormality. Hepatobiliary: No focal liver abnormality is seen. No gallstones, gallbladder wall thickening, or biliary dilatation. Pancreas: Unremarkable. No pancreatic ductal dilatation or surrounding inflammatory changes. Spleen: Normal in size without focal abnormality. Adrenals/Urinary Tract: Adrenal glands are unremarkable. Kidneys are normal, without renal calculi, focal lesion, or hydronephrosis. Bladder is unremarkable. Stomach/Bowel: Stomach and small bowel decompressed. Surgical clips at the base of the cecum. Appendix surgically absent. The colon is nondilated. No focal inflammatory process or wall thickening evident. Vascular/Lymphatic: No significant vascular findings are present. No enlarged abdominal or pelvic lymph nodes. Reproductive: Uterus and bilateral adnexa are unremarkable. Bilateral tubal ligation clips. Other: No ascites.  No free air. Musculoskeletal: Small ventral hernia containing only mesenteric fat. Mild inflammatory/edematous changes around a segment of the fat centrally within the hernia. Negative for fracture or other worrisome bone lesion. IMPRESSION: 1. Small supraumbilical ventral hernia, with edema/ Inflammation around a portion of involved mesenteric fat. 2. No acute intraabdominal pathology.  Electronically Signed   By: DLucrezia EuropeM.D.   On: 12/10/2016 13:53      Assessment/Plan   Omentum incarcerated ventral hernia - unreducible - will admit for surgery tomorrow - no antibiotics indicated  Full liquids until NPO midnight   JKalman Drape [facility] 12/10/2016, 3:02 PM Pager: [phone]: [phone]-Fri 7:00 am-4:30 pm Sat-Sun 7:00 am-11:30 am

## 2016-12-10 NOTE — ED Triage Notes (Signed)
Pt presents for evaluation of umbilical hernia. Pt reports has been keeping god son and having to lift him x 2-3 days, reports always noticed area where hernia was but since yesterday has increased bulging and severe pain. No other symptoms reported.

## 2016-12-11 ENCOUNTER — Observation Stay (HOSPITAL_COMMUNITY): Payer: Commercial Managed Care - PPO | Admitting: Anesthesiology

## 2016-12-11 ENCOUNTER — Encounter (HOSPITAL_COMMUNITY): Admission: EM | Disposition: A | Discharge status: Home / Self Care | Payer: Self-pay | Source: Home / Self Care

## 2016-12-11 HISTORY — PX: INCISIONAL HERNIA REPAIR: SHX193

## 2016-12-11 LAB — BASIC METABOLIC PANEL
Anion gap: 7 (ref 5–15)
CO2: 25 mmol/L (ref 22–32)
CREATININE: 0.75 mg/dL (ref 0.44–1.00)
Calcium: 8.6 mg/dL — ABNORMAL LOW (ref 8.9–10.3)
Chloride: 107 mmol/L (ref 101–111)
GFR calc Af Amer: >60 mL/min (ref >60)
GFR calc non Af Amer: >60 mL/min (ref >60)
Glucose, Bld: 90 mg/dL (ref 65–99)
Potassium: 3.6 mmol/L (ref 3.5–5.1)
SODIUM: 139 mmol/L (ref 135–145)

## 2016-12-11 LAB — CBC
HCT: 41.5 % (ref 36.0–46.0)
Hemoglobin: 13.5 g/dL (ref 12.0–15.0)
MCH: 28.8 pg (ref 26.0–34.0)
MCHC: 32.5 g/dL (ref 30.0–36.0)
MCV: 88.7 fL (ref 78.0–100.0)
PLATELETS: 257 10*3/uL (ref 150–400)
RBC: 4.68 MIL/uL (ref 3.87–5.11)
RDW: 15.1 % (ref 11.5–15.5)
WBC: 10.4 10*3/uL (ref 4.0–10.5)

## 2016-12-11 LAB — HIV ANTIBODY (ROUTINE TESTING W REFLEX): HIV SCREEN 4TH GENERATION: NONREACTIVE

## 2016-12-11 SURGERY — REPAIR, HERNIA, INCISIONAL, LAPAROSCOPIC
Anesthesia: General | Site: Abdomen

## 2016-12-11 MED ORDER — CEFAZOLIN SODIUM-DEXTROSE 2-3 GM-% IV SOLR
INTRAVENOUS | Status: DC | PRN
  Administered 2016-12-11: 2 g via INTRAVENOUS

## 2016-12-11 MED ORDER — ROCURONIUM BROMIDE 50 MG/5ML IV SOSY
PREFILLED_SYRINGE | INTRAVENOUS | Status: AC
  Filled 2016-12-11: qty 5

## 2016-12-11 MED ORDER — ONDANSETRON HCL 4 MG/2ML IJ SOLN
INTRAMUSCULAR | Status: AC
  Filled 2016-12-11: qty 2

## 2016-12-11 MED ORDER — SCOPOLAMINE 1 MG/3DAYS TD PT72
MEDICATED_PATCH | TRANSDERMAL | Status: AC
  Filled 2016-12-11: qty 1

## 2016-12-11 MED ORDER — PROPOFOL 500 MG/50ML IV EMUL
INTRAVENOUS | Status: DC | PRN
  Administered 2016-12-11: 30 ug/kg/min via INTRAVENOUS

## 2016-12-11 MED ORDER — PHENYLEPHRINE 40 MCG/ML (10ML) SYRINGE FOR IV PUSH (FOR BLOOD PRESSURE SUPPORT)
PREFILLED_SYRINGE | INTRAVENOUS | Status: DC | PRN
  Administered 2016-12-11: 160 ug via INTRAVENOUS
  Administered 2016-12-11: 120 ug via INTRAVENOUS

## 2016-12-11 MED ORDER — KETOROLAC TROMETHAMINE 30 MG/ML IJ SOLN
INTRAMUSCULAR | Status: DC | PRN
  Administered 2016-12-11: 30 mg via INTRAVENOUS

## 2016-12-11 MED ORDER — HYDROMORPHONE HCL 1 MG/ML IJ SOLN
0.2500 mg | INTRAMUSCULAR | Status: DC | PRN
  Administered 2016-12-11 (×2): 0.5 mg via INTRAVENOUS

## 2016-12-11 MED ORDER — ALBUMIN HUMAN 5 % IV SOLN
INTRAVENOUS | Status: DC | PRN
  Administered 2016-12-11: 10:00:00 via INTRAVENOUS

## 2016-12-11 MED ORDER — EPHEDRINE SULFATE-NACL 50-0.9 MG/10ML-% IV SOSY
PREFILLED_SYRINGE | INTRAVENOUS | Status: DC | PRN
  Administered 2016-12-11: 15 mg via INTRAVENOUS
  Administered 2016-12-11: 10 mg via INTRAVENOUS

## 2016-12-11 MED ORDER — LIDOCAINE 2% (20 MG/ML) 5 ML SYRINGE
INTRAMUSCULAR | Status: AC
  Filled 2016-12-11: qty 5

## 2016-12-11 MED ORDER — DIPHENHYDRAMINE HCL 50 MG/ML IJ SOLN
INTRAMUSCULAR | Status: AC
  Filled 2016-12-11: qty 1

## 2016-12-11 MED ORDER — LIDOCAINE 2% (20 MG/ML) 5 ML SYRINGE
INTRAMUSCULAR | Status: DC | PRN
Start: 2016-12-11 — End: 2016-12-11
  Administered 2016-12-11: 80 mg via INTRAVENOUS

## 2016-12-11 MED ORDER — DIPHENHYDRAMINE HCL 50 MG/ML IJ SOLN
INTRAMUSCULAR | Status: DC | PRN
  Administered 2016-12-11: 12.5 mg via INTRAVENOUS

## 2016-12-11 MED ORDER — FENTANYL CITRATE (PF) 100 MCG/2ML IJ SOLN
INTRAMUSCULAR | Status: AC
  Filled 2016-12-11: qty 4

## 2016-12-11 MED ORDER — PROPOFOL 1000 MG/100ML IV EMUL
INTRAVENOUS | Status: AC
  Filled 2016-12-11: qty 100

## 2016-12-11 MED ORDER — BUPIVACAINE HCL (PF) 0.25 % IJ SOLN
INTRAMUSCULAR | Status: AC
  Filled 2016-12-11: qty 30

## 2016-12-11 MED ORDER — HYDROMORPHONE HCL 1 MG/ML IJ SOLN
INTRAMUSCULAR | Status: DC | PRN
  Administered 2016-12-11: 1 mg via INTRAVENOUS

## 2016-12-11 MED ORDER — DEXAMETHASONE SODIUM PHOSPHATE 10 MG/ML IJ SOLN
INTRAMUSCULAR | Status: AC
  Filled 2016-12-11: qty 1

## 2016-12-11 MED ORDER — ACETAMINOPHEN 10 MG/ML IV SOLN
INTRAVENOUS | Status: DC | PRN
  Administered 2016-12-11: 1000 mg via INTRAVENOUS

## 2016-12-11 MED ORDER — HYDROMORPHONE HCL 1 MG/ML IJ SOLN
INTRAMUSCULAR | Status: AC
  Filled 2016-12-11: qty 1

## 2016-12-11 MED ORDER — SODIUM CHLORIDE 0.9 % IV SOLN
INTRAVENOUS | Status: DC
  Administered 2016-12-11 – 2016-12-12 (×2): via INTRAVENOUS

## 2016-12-11 MED ORDER — ONDANSETRON HCL 4 MG/2ML IJ SOLN
INTRAMUSCULAR | Status: DC | PRN
  Administered 2016-12-11 (×2): 4 mg via INTRAVENOUS

## 2016-12-11 MED ORDER — ACETAMINOPHEN 10 MG/ML IV SOLN
INTRAVENOUS | Status: AC
  Filled 2016-12-11: qty 100

## 2016-12-11 MED ORDER — MIDAZOLAM HCL 2 MG/2ML IJ SOLN
INTRAMUSCULAR | Status: DC | PRN
  Administered 2016-12-11: 2 mg via INTRAVENOUS

## 2016-12-11 MED ORDER — SUGAMMADEX SODIUM 200 MG/2ML IV SOLN
INTRAVENOUS | Status: DC | PRN
  Administered 2016-12-11: 200 mg via INTRAVENOUS

## 2016-12-11 MED ORDER — DEXAMETHASONE SODIUM PHOSPHATE 10 MG/ML IJ SOLN
INTRAMUSCULAR | Status: DC | PRN
  Administered 2016-12-11: 10 mg via INTRAVENOUS

## 2016-12-11 MED ORDER — LACTATED RINGERS IV SOLN
INTRAVENOUS | Status: DC
  Administered 2016-12-11 (×2): via INTRAVENOUS

## 2016-12-11 MED ORDER — ESCITALOPRAM OXALATE 20 MG PO TABS
20.0000 mg | ORAL_TABLET | Freq: Every day | ORAL | Status: DC
  Administered 2016-12-11 – 2016-12-13 (×3): 20 mg via ORAL
  Filled 2016-12-11 (×3): qty 1

## 2016-12-11 MED ORDER — FENTANYL CITRATE (PF) 100 MCG/2ML IJ SOLN
INTRAMUSCULAR | Status: DC | PRN
  Administered 2016-12-11 (×4): 50 ug via INTRAVENOUS

## 2016-12-11 MED ORDER — BUPIVACAINE-EPINEPHRINE 0.25% -1:200000 IJ SOLN
INTRAMUSCULAR | Status: DC | PRN
  Administered 2016-12-11: 19 mL

## 2016-12-11 MED ORDER — PHENYLEPHRINE 40 MCG/ML (10ML) SYRINGE FOR IV PUSH (FOR BLOOD PRESSURE SUPPORT)
PREFILLED_SYRINGE | INTRAVENOUS | Status: AC
  Filled 2016-12-11: qty 10

## 2016-12-11 MED ORDER — PROPOFOL 10 MG/ML IV BOLUS
INTRAVENOUS | Status: AC
  Filled 2016-12-11: qty 20

## 2016-12-11 MED ORDER — SUCCINYLCHOLINE CHLORIDE 200 MG/10ML IV SOSY
PREFILLED_SYRINGE | INTRAVENOUS | Status: AC
  Filled 2016-12-11: qty 10

## 2016-12-11 MED ORDER — SCOPOLAMINE 1 MG/3DAYS TD PT72
MEDICATED_PATCH | TRANSDERMAL | Status: DC | PRN
  Administered 2016-12-11: 1 via TRANSDERMAL

## 2016-12-11 MED ORDER — 0.9 % SODIUM CHLORIDE (POUR BTL) OPTIME
TOPICAL | Status: DC | PRN
  Administered 2016-12-11: 1000 mL

## 2016-12-11 MED ORDER — ONDANSETRON HCL 4 MG/2ML IJ SOLN
INTRAMUSCULAR | Status: AC
  Filled 2016-12-11: qty 4

## 2016-12-11 MED ORDER — MIDAZOLAM HCL 2 MG/2ML IJ SOLN
INTRAMUSCULAR | Status: AC
  Filled 2016-12-11: qty 2

## 2016-12-11 MED ORDER — EPHEDRINE 5 MG/ML INJ
INTRAVENOUS | Status: AC
  Filled 2016-12-11: qty 10

## 2016-12-11 MED ORDER — PROPOFOL 10 MG/ML IV BOLUS
INTRAVENOUS | Status: DC | PRN
  Administered 2016-12-11: 200 mg via INTRAVENOUS

## 2016-12-11 MED ORDER — ROCURONIUM BROMIDE 10 MG/ML (PF) SYRINGE
PREFILLED_SYRINGE | INTRAVENOUS | Status: DC | PRN
  Administered 2016-12-11: 30 mg via INTRAVENOUS
  Administered 2016-12-11: 20 mg via INTRAVENOUS

## 2016-12-11 MED ORDER — SUCCINYLCHOLINE CHLORIDE 200 MG/10ML IV SOSY
PREFILLED_SYRINGE | INTRAVENOUS | Status: DC | PRN
  Administered 2016-12-11: 160 mg via INTRAVENOUS

## 2016-12-11 MED ORDER — SODIUM CHLORIDE 0.9 % IR SOLN
Status: DC | PRN
  Administered 2016-12-11: 500 mL

## 2016-12-11 MED ORDER — ALBUTEROL SULFATE HFA 108 (90 BASE) MCG/ACT IN AERS
INHALATION_SPRAY | RESPIRATORY_TRACT | Status: AC
Start: 2016-12-11 — End: 2016-12-11
  Filled 2016-12-11: qty 6.7

## 2016-12-11 MED ORDER — PROMETHAZINE HCL 25 MG/ML IJ SOLN: 6.2500 mg | INTRAMUSCULAR | Status: DC | PRN

## 2016-12-11 MED ORDER — ALBUTEROL SULFATE HFA 108 (90 BASE) MCG/ACT IN AERS
INHALATION_SPRAY | RESPIRATORY_TRACT | Status: DC | PRN
  Administered 2016-12-11 (×2): 6 via RESPIRATORY_TRACT

## 2016-12-11 MED ORDER — KETOROLAC TROMETHAMINE 30 MG/ML IJ SOLN
INTRAMUSCULAR | Status: AC
  Filled 2016-12-11: qty 1

## 2016-12-11 MED ORDER — HYDROMORPHONE HCL 2 MG/ML IJ SOLN
0.5000 mg | INTRAMUSCULAR | Status: DC | PRN
  Administered 2016-12-11 – 2016-12-13 (×6): 1 mg via INTRAVENOUS
  Filled 2016-12-11 (×6): qty 1

## 2016-12-11 SURGICAL SUPPLY — 60 items
APPLIER CLIP 5 13 M/L LIGAMAX5 (MISCELLANEOUS)
APPLIER CLIP LOGIC TI 5 (MISCELLANEOUS) IMPLANT
APPLIER CLIP ROT 10 11.4 M/L (STAPLE)
BAG DECANTER FOR FLEXI CONT (MISCELLANEOUS) ×3 IMPLANT
BINDER ABD UNIV 10 28-50 (GAUZE/BANDAGES/DRESSINGS) ×1 IMPLANT
BINDER ABDOM UNIV 10 (GAUZE/BANDAGES/DRESSINGS) ×3
BINDER ABDOMINAL 12 ML 46-62 (SOFTGOODS) ×3 IMPLANT
BLADE CLIPPER SURG (BLADE) IMPLANT
BNDG GAUZE ELAST 4 BULKY (GAUZE/BANDAGES/DRESSINGS) IMPLANT
CANISTER SUCT 3000ML PPV (MISCELLANEOUS) IMPLANT
CHLORAPREP W/TINT 26ML (MISCELLANEOUS) ×6 IMPLANT
CLIP APPLIE 5 13 M/L LIGAMAX5 (MISCELLANEOUS) IMPLANT
CLIP APPLIE ROT 10 11.4 M/L (STAPLE) IMPLANT
CLOSURE WOUND 1/2 X4 (GAUZE/BANDAGES/DRESSINGS) ×2
COVER SURGICAL LIGHT HANDLE (MISCELLANEOUS) ×3 IMPLANT
DECANTER SPIKE VIAL GLASS SM (MISCELLANEOUS) ×3 IMPLANT
DERMABOND ADVANCED (GAUZE/BANDAGES/DRESSINGS) ×4
DERMABOND ADVANCED .7 DNX12 (GAUZE/BANDAGES/DRESSINGS) ×2 IMPLANT
DEVICE RELIATACK FIXATION (MISCELLANEOUS) ×3 IMPLANT
DEVICE SECURE STRAP 25 ABSORB (INSTRUMENTS) ×6 IMPLANT
DEVICE TROCAR PUNCTURE CLOSURE (ENDOMECHANICALS) ×9 IMPLANT
DRAPE INCISE IOBAN 66X45 STRL (DRAPES) ×3 IMPLANT
DRAPE LAPAROSCOPIC ABDOMINAL (DRAPES) ×3 IMPLANT
DRSG TEGADERM 2-3/8X2-3/4 SM (GAUZE/BANDAGES/DRESSINGS) ×3 IMPLANT
ELECT REM PT RETURN 9FT ADLT (ELECTROSURGICAL) ×6
ELECTRODE REM PT RTRN 9FT ADLT (ELECTROSURGICAL) ×2 IMPLANT
GLOVE BIO SURGEON STRL SZ7 (GLOVE) ×3 IMPLANT
GLOVE BIOGEL PI IND STRL 7.5 (GLOVE) ×1 IMPLANT
GLOVE BIOGEL PI IND STRL 8 (GLOVE) ×1 IMPLANT
GLOVE BIOGEL PI INDICATOR 7.5 (GLOVE) ×2
GLOVE BIOGEL PI INDICATOR 8 (GLOVE) ×2
GLOVE ECLIPSE 7.5 STRL STRAW (GLOVE) ×3 IMPLANT
GOWN STRL REUS W/ TWL LRG LVL3 (GOWN DISPOSABLE) ×6 IMPLANT
GOWN STRL REUS W/TWL LRG LVL3 (GOWN DISPOSABLE) ×12
KIT BASIN OR (CUSTOM PROCEDURE TRAY) ×6 IMPLANT
KIT ROOM TURNOVER OR (KITS) ×6 IMPLANT
MARKER SKIN DUAL TIP RULER LAB (MISCELLANEOUS) ×6 IMPLANT
MESH VENTRALIGHT ST 6IN CRC (Mesh General) ×3 IMPLANT
NEEDLE SPNL 22GX3.5 QUINCKE BK (NEEDLE) ×9 IMPLANT
NS IRRIG 1000ML POUR BTL (IV SOLUTION) ×6 IMPLANT
PAD ARMBOARD 7.5X6 YLW CONV (MISCELLANEOUS) ×12 IMPLANT
RELOAD RELIATACK 10 (MISCELLANEOUS) IMPLANT
RELOAD RELIATACK 5 (MISCELLANEOUS) IMPLANT
SCISSORS LAP 5X35 DISP (ENDOMECHANICALS) ×3 IMPLANT
SET IRRIG TUBING LAPAROSCOPIC (IRRIGATION / IRRIGATOR) IMPLANT
SHEARS HARMONIC ACE PLUS 36CM (ENDOMECHANICALS) ×3 IMPLANT
SLEEVE ENDOPATH XCEL 5M (ENDOMECHANICALS) ×6 IMPLANT
STRIP CLOSURE SKIN 1/2X4 (GAUZE/BANDAGES/DRESSINGS) ×4 IMPLANT
SUT MNCRL AB 4-0 PS2 18 (SUTURE) ×6 IMPLANT
SUT NOVA 0 T19/GS 22DT (SUTURE) ×3 IMPLANT
SUT NOVA NAB GS-21 0 18 T12 DT (SUTURE) ×3 IMPLANT
SUT PROLENE 0 CT 1 CR/8 (SUTURE) ×3 IMPLANT
TOWEL OR 17X24 6PK STRL BLUE (TOWEL DISPOSABLE) ×6 IMPLANT
TOWEL OR 17X26 10 PK STRL BLUE (TOWEL DISPOSABLE) ×6 IMPLANT
TRAY FOLEY CATH SILVER 14FR (SET/KITS/TRAYS/PACK) ×3 IMPLANT
TRAY LAPAROSCOPIC MC (CUSTOM PROCEDURE TRAY) ×6 IMPLANT
TROCAR XCEL BLUNT TIP 100MML (ENDOMECHANICALS) IMPLANT
TROCAR XCEL NON-BLD 11X100MML (ENDOMECHANICALS) ×6 IMPLANT
TROCAR XCEL NON-BLD 5MMX100MML (ENDOMECHANICALS) ×6 IMPLANT
TUBING INSUFFLATION (TUBING) ×6 IMPLANT

## 2016-12-11 NOTE — Transfer of Care (Addendum)
Immediate Anesthesia Transfer of Care Note  Patient: Krystal Reeves  Procedure(s) Performed: Procedure(s): LAPAROSCOPIC INCISIONAL HERNIA REPAIR WITH MESH (N/A)  Patient Location: PACU  Anesthesia Type:General  Level of Consciousness: awake, alert , oriented and patient cooperative  Airway & Oxygen Therapy: Patient Spontanous Breathing and Patient connected to face mask oxygen  Post-op Assessment: Report given to RN, Post -op Vital signs reviewed and stable and Patient moving all extremities X 4  Post vital signs: Reviewed and stable  Last Vitals:  Vitals:   12/10/16 2049 12/11/16 0546  BP: 118/72 121/83  Pulse: 77 71  Resp: 19 19  Temp: 36.6 C 36.6 C    Last Pain:  Vitals:   12/11/16 0650  TempSrc:   PainSc: Asleep         Complications: No apparent anesthesia complications

## 2016-12-11 NOTE — Interval H&P Note (Signed)
History and Physical Interval Note:  Patient with incarcerated fat in previous laparoscopic incisional site.  Laparoscopic repair likely to do well.  For surgery today.  Marta LamasJames O. Gae BonWyatt, III, MD, FACS [phone]--pager [phone]--office [facility]   12/11/2016 8:01 AM  Krystal Reeves  has presented today for surgery, with the diagnosis of fat containing incarcerated ventral hernia  The various methods of treatment have been discussed with the patient and family. After consideration of risks, benefits and other options for treatment, the patient has consented to  Procedure(s): LAPAROSCOPIC INCISIONAL HERNIA REPAIR WITH MESH (N/A) as a surgical intervention .  The patient's history has been reviewed, patient examined, no change in status, stable for surgery.  I have reviewed the patient's chart and labs.  Questions were answered to the patient's satisfaction.     Krystal Reeves

## 2016-12-11 NOTE — Op Note (Signed)
OPERATIVE REPORT  DATE OF OPERATION:  12/11/2016  PATIENT:  Krystal Reeves  31 y.o. female  PRE-OPERATIVE DIAGNOSIS:  fat containing incarcerated ventral hernia  POST-OPERATIVE DIAGNOSIS:  fat containing incarcerated ventral hernia  INDICATION(S) FOR OPERATION:  Incarcerated, painful periumbilical hernia  FINDINGS:  Incarcerated preperitoneal fat and falciform ligament  PROCEDURE:  Procedure(s): LAPAROSCOPIC INCISIONAL HERNIA REPAIR WITH MESH  SURGEON:  Surgeon(s): Jimmye NormanJames Teodor Prater, MD  ASSISTANT: None  ANESTHESIA:   general  COMPLICATIONS:  Nonoe  EBL: <20 ml  BLOOD ADMINISTERED: none  DRAINS: none   SPECIMEN:  No Specimen  COUNTS CORRECT:  YES  PROCEDURE DETAILS: The patient was taken to the operating room and placed on the table in the supine position. After an adequate general endotracheal anesthetic was administered, she was prepped and draped in usual sterile manner exposing her entire abdomen.  A proper timeout was performed identifying the patient and procedure to be performed. We started with an Optiview 11 mm cannula and trocar in the left upper quadrant with an inserted camera and light source. We will able to enter the peritoneal cavity safely using the Optiview and then insufflated the peritoneal cavity with carbon dioxide gas up to a maximal intra-abdominal pressure of 15 mmHg.  A 5 mm left lower quadrant trocar and cannula were passed under direct vision along with a right lower quadrant 5 mm cannula. We subsequently worked on the hernia defect which is palpable for externally.  The hernia defect was covered by peritoneum we had to dissected free using a Harmonic Scalpel. We then were able to reduce a large amount of preperitoneal fat from the hernia defect. Affect itself measured approximately 3 cm in size. We measured from the edges of was marked with a spinal needle and came out with a total diameter of 15 cm. We used a 15 cm piece of coated mesh to implant  internally. 4 equally spaced 0 Novafil sutures were placed on the abdominal wall surface of the mesh. We used a suture retriever in order to pull them out equally and subsequently tacked in place using a Secure Strap tacker.  Once the mesh and then secured in place, we allowed the gas to escape the peritoneal cavity through the cannulas. We removed all cannulas.  The only side that was closed with suture on the skin with the 11 mm cannula site which was closed using running subcuticular stitch of 4-0 Monocryl. 0.5% Marcaine was injected at all sites. Steri-Strips Dermabond and Tegaderm used to complete mostly dressings. All needle counts, sponge counts, and his [employer] were correct.  PATIENT DISPOSITION:  PACU - hemodynamically stable.   [name] 3/17/201811:25 AM

## 2016-12-11 NOTE — Progress Notes (Signed)
Second IV started at patient request d/t RAC IV prohibiting movement of arm when on IV pump

## 2016-12-11 NOTE — Anesthesia Postprocedure Evaluation (Addendum)
Anesthesia Post Note  Patient: Krystal Reeves  Procedure(s) Performed: Procedure(s) (LRB): LAPAROSCOPIC INCISIONAL HERNIA REPAIR WITH MESH (N/A)  Patient location during evaluation: PACU Anesthesia Type: General Level of consciousness: sedated Pain management: pain level controlled Vital Signs Assessment: post-procedure vital signs reviewed and stable Respiratory status: spontaneous breathing and respiratory function stable Cardiovascular status: stable Anesthetic complications: no       Last Vitals:  Vitals:   12/11/16 1155 12/11/16 1208  BP: 120/87 115/86  Pulse: 72 87  Resp: 14 (!) 22  Temp:  36.6 C    Last Pain:  Vitals:   12/11/16 1208  TempSrc:   PainSc: 5                  Refugio Mcconico DANIEL

## 2016-12-11 NOTE — Anesthesia Procedure Notes (Signed)
Procedure Name: Intubation Date/Time: 12/11/2016 9:33 AM Performed by: Geraldo DockerSOLHEIM, Benino Korinek SALOMON Pre-anesthesia Checklist: Patient identified, Patient being monitored, Timeout performed, Emergency Drugs available and Suction available Patient Re-evaluated:Patient Re-evaluated prior to inductionOxygen Delivery Method: Circle System Utilized Preoxygenation: Pre-oxygenation with 100% oxygen Intubation Type: IV induction, Rapid sequence and Cricoid Pressure applied Laryngoscope Size: Miller and 3 Grade View: Grade I Tube type: Oral Tube size: 7.5 mm Number of attempts: 1 Airway Equipment and Method: Stylet Placement Confirmation: ETT inserted through vocal cords under direct vision,  positive ETCO2 and breath sounds checked- equal and bilateral Secured at: 22 cm Tube secured with: Tape Dental Injury: Teeth and Oropharynx as per pre-operative assessment

## 2016-12-12 MED ORDER — KETOROLAC TROMETHAMINE 30 MG/ML IJ SOLN
30.0000 mg | Freq: Four times a day (QID) | INTRAMUSCULAR | Status: DC
  Administered 2016-12-12 – 2016-12-13 (×4): 30 mg via INTRAVENOUS
  Filled 2016-12-12 (×4): qty 1

## 2016-12-12 MED ORDER — METHOCARBAMOL 500 MG PO TABS
500.0000 mg | ORAL_TABLET | Freq: Four times a day (QID) | ORAL | Status: DC | PRN
  Administered 2016-12-12 – 2016-12-13 (×4): 500 mg via ORAL
  Filled 2016-12-12 (×4): qty 1

## 2016-12-12 NOTE — Progress Notes (Signed)
1 Day Post-Op  Subjective: Very sore - not moving much No nausea or vomiting Still requiring IV pain meds Muscle spasms  Objective: Vital signs in last 24 hours: Temp:  [97.6 F (36.4 C)-98.7 F (37.1 C)] 98.2 F (36.8 C) (03/18 0427) Pulse Rate:  [71-94] 79 (03/18 0427) Resp:  [14-22] 19 (03/18 0427) BP: (115-138)/(69-87) 123/79 (03/18 0427) SpO2:  [100 %] 100 % (03/18 0427) Last BM Date: 12/11/16  Intake/Output from previous day: 03/17 0701 - 03/18 0700 In: 2390 [P.O.:240; I.V.:1900; IV Piggyback:250] Out: 910 [Urine:750; Blood:10] Intake/Output this shift: No intake/output data recorded.  General appearance: alert, cooperative and no distress GI: soft, tender, mostly on right side; no peritonitis Incisions c/d/i  Lab Results:   Recent Labs  12/10/16 1039 12/11/16 0517  WBC 10.5 10.4  HGB 13.7 13.5  HCT 41.9 41.5  PLT 299 257   BMET  Recent Labs  12/10/16 1039 12/11/16 0517  NA 138 139  K 3.3* 3.6  CL 108 107  CO2 21* 25  GLUCOSE 114* 90  BUN <5* <5*  CREATININE 0.74 0.75  CALCIUM 9.1 8.6*   PT/INR No results for input(s): LABPROT, INR in the last 72 hours. ABG No results for input(s): PHART, HCO3 in the last 72 hours.  Invalid input(s): PCO2, PO2  Studies/Results: Ct Abdomen Pelvis W Contrast  Result Date: 12/10/2016 CLINICAL DATA:  Ventral hernia EXAM: CT ABDOMEN AND PELVIS WITH CONTRAST TECHNIQUE: Multidetector CT imaging of the abdomen and pelvis was performed using the standard protocol following bolus administration of intravenous contrast. CONTRAST:  100mL ISOVUE-300 IOPAMIDOL (ISOVUE-300) INJECTION 61% COMPARISON:  None. FINDINGS: Lower chest: No acute abnormality. Hepatobiliary: No focal liver abnormality is seen. No gallstones, gallbladder wall thickening, or biliary dilatation. Pancreas: Unremarkable. No pancreatic ductal dilatation or surrounding inflammatory changes. Spleen: Normal in size without focal abnormality. Adrenals/Urinary  Tract: Adrenal glands are unremarkable. Kidneys are normal, without renal calculi, focal lesion, or hydronephrosis. Bladder is unremarkable. Stomach/Bowel: Stomach and small bowel decompressed. Surgical clips at the base of the cecum. Appendix surgically absent. The colon is nondilated. No focal inflammatory process or wall thickening evident. Vascular/Lymphatic: No significant vascular findings are present. No enlarged abdominal or pelvic lymph nodes. Reproductive: Uterus and bilateral adnexa are unremarkable. Bilateral tubal ligation clips. Other: No ascites.  No free air. Musculoskeletal: Small ventral hernia containing only mesenteric fat. Mild inflammatory/edematous changes around a segment of the fat centrally within the hernia. Negative for fracture or other worrisome bone lesion. IMPRESSION: 1. Small supraumbilical ventral hernia, with edema/ Inflammation around a portion of involved mesenteric fat. 2. No acute intraabdominal pathology. Electronically Signed   By: Corlis Leak  Hassell M.D.   On: 12/10/2016 13:53    Anti-infectives: Anti-infectives    Start     Dose/Rate Route Frequency Ordered Stop   12/11/16 1045  polymyxin B 500,000 Units, bacitracin 50,000 Units in sodium chloride irrigation 0.9 % 500 mL irrigation  Status:  Discontinued       As needed 12/11/16 1052 12/11/16 1131   12/11/16 0600  ceFAZolin (ANCEF) IVPB 2g/100 mL premix  Status:  Discontinued     2 g 200 mL/hr over 30 Minutes Intravenous On call to O.R. 12/10/16 1521 12/11/16 1239      Assessment/Plan: s/p Procedure(s): LAPAROSCOPIC INCISIONAL HERNIA REPAIR WITH MESH (N/A) Advance diet Robaxin PRN Toradol q6 Encourage ambulation  Possible discharge Monday   LOS: 0 days    Lashawne Dura K. 12/12/2016

## 2016-12-13 ENCOUNTER — Encounter (HOSPITAL_COMMUNITY): Payer: Self-pay | Admitting: General Surgery

## 2016-12-13 MED ORDER — OXYCODONE HCL 5 MG PO TABS: 5.0000 mg | ORAL_TABLET | ORAL | 0 refills | Status: AC | PRN

## 2016-12-13 NOTE — Discharge Instructions (Signed)
Please arrive at least 30 min before your appointment to complete your check in paperwork.  If you are unable to arrive 30 min prior to your appointment time we may have to cancel or reschedule you. ° °LAPAROSCOPIC SURGERY: POST OP INSTRUCTIONS  °1. DIET: Follow a light bland diet the first 24 hours after arrival home, such as soup, liquids, crackers, etc. Be sure to include lots of fluids daily. Avoid fast food or heavy meals as your are more likely to get nauseated. Eat a low fat the next few days after surgery.  °2. Take your usually prescribed home medications unless otherwise directed. °3. PAIN CONTROL:  °1. Pain is best controlled by a usual combination of three different methods TOGETHER:  °1. Ice/Heat °2. Over the counter pain medication °3. Prescription pain medication °2. Most patients will experience some swelling and bruising around the incisions. Ice packs or heating pads (30-60 minutes up to 6 times a day) will help. Use ice for the first few days to help decrease swelling and bruising, then switch to heat to help relax tight/sore spots and speed recovery. Some people prefer to use ice alone, heat alone, alternating between ice & heat. Experiment to what works for you. Swelling and bruising can take several weeks to resolve.  °3. It is helpful to take an over-the-counter pain medication regularly for the first few weeks. Choose one of the following that works best for you:  °1. Naproxen (Aleve, etc) Two 220mg tabs twice a day °2. Ibuprofen (Advil, etc) Three 200mg tabs four times a day (every meal & bedtime) °3. Acetaminophen (Tylenol, etc) 500-650mg four times a day (every meal & bedtime) °4. A prescription for pain medication (such as oxycodone, hydrocodone, etc) should be given to you upon discharge. Take your pain medication as prescribed.  °1. If you are having problems/concerns with the prescription medicine (does not control pain, nausea, vomiting, rash, itching, etc), please call us [phone]  [phone] to see if we need to switch you to a different pain medicine that will work better for you and/or control your side effect better. °2. If you need a refill on your pain medication, please contact your pharmacy. They will contact our office to request authorization. Prescriptions will not be filled after 5 pm or on week-ends. °4. Avoid getting constipated. Between the surgery and the pain medications, it is common to experience some constipation. Increasing fluid intake and taking a fiber supplement (such as Metamucil, Citrucel, FiberCon, MiraLax, etc) 1-2 times a day regularly will usually help prevent this problem from occurring. A mild laxative (prune juice, Milk of Magnesia, MiraLax, etc) should be taken according to package directions if there are no bowel movements after 48 hours.  °5. Watch out for diarrhea. If you have many loose bowel movements, simplify your diet to bland foods & liquids for a few days. Stop any stool softeners and decrease your fiber supplement. Switching to mild anti-diarrheal medications (Kayopectate, Pepto Bismol) can help. If this worsens or does not improve, please call us. °6. Wash / shower every day. You may shower over the dressings as they are waterproof. Continue to shower over incision(s) after the dressing is off. °7. Remove your waterproof bandages 5 days after surgery. You may leave the incision open to air. You may replace a dressing/Band-Aid to cover the incision for comfort if you wish.  °8. ACTIVITIES as tolerated:  °1. You may resume regular (light) daily activities beginning the next day--such as daily self-care, walking, climbing stairs--gradually   increasing activities as tolerated. If you can walk 30 minutes without difficulty, it is safe to try more intense activity such as jogging, treadmill, bicycling, low-impact aerobics, swimming, etc. 2. Refrain from any heavy lifting (>15lbs) or straining for 6-8 weeks  3. DO NOT PUSH THROUGH PAIN. Let pain be your  guide: If it hurts to do something, don't do it. Pain is your body warning you to avoid that activity for another week until the pain goes down. 4. You may drive when you are no longer taking prescription pain medication, you can comfortably wear a seatbelt, and you can safely maneuver your car and apply brakes. 5. You may have sexual intercourse when it is comfortable.  9. FOLLOW UP in our office  1. Please call [facility] at [phone] to set up an appointment to see your surgeon in the office for a follow-up appointment approximately 2-3 weeks after your surgery. 2. Make sure that you call for this appointment the day you arrive home to insure a convenient appointment time.      10. IF YOU HAVE DISABILITY OR FAMILY LEAVE FORMS, BRING THEM TO THE               OFFICE FOR PROCESSING.   WHEN TO CALL us [phone]:  1. Poor pain control 2. Reactions / problems with new medications (rash/itching, nausea, etc)  3. Fever over 101.5 F (38.5 C) 4. Inability to urinate 5. Nausea and/or vomiting 6. Worsening swelling or bruising 7. Continued bleeding from incision. 8. Increased pain, redness, or drainage from the incision  The clinic staff is available to answer your questions during regular business hours (8:30am-5pm). Please dont hesitate to call and ask to speak to one of our nurses for clinical concerns.  If you have a medical emergency, go to the nearest emergency room or call 911.  A surgeon from [facility] is always on call at the [facility], [place]  [address], [place], [place] [address] ?  MAIN: [phone] ? TOLL FREE: [phone] ?  FAX [phone]  [email or web address]    Laparoscopic Ventral Hernia Repair Laparoscopic ventral hernia repairis a procedure to fix a bulge of tissue that pushes through a weak area of muscle in the abdomen (ventral hernia). A ventral hernia may be at the belly button  (umbilical), above the belly button (epigastric), or at the incision site from previous abdominal surgery (incisional hernia). You may have this procedure as emergency surgery if part of your intestine gets trapped inside the hernia and starts to lose its blood supply (strangulation). Laparoscopic surgery is done through small incisions using a thin surgical telescope with a light and camera on the end (laparoscope). During surgery, your surgeon will use images from the laparoscope to guide the procedure. A mesh screen will be placed in the hernia to close the opening and strengthen the abdominal wall. Tell a health care provider about:  Any allergies you have.  All medicines you are taking, including vitamins, herbs, eye drops, creams, and over-the-counter medicines.  Any problems you or family members have had with anesthetic medicines.  Any blood disorders you have.  Any surgeries you have had.  Any medical conditions you have.  Whether you are pregnant or may be pregnant. What are the risks? Generally, this is a safe procedure. However, problems may occur, including:  Infection.  Bleeding.  Allergic reactions to medicines.  Damage to other structures or organs in the  abdomen.  Trouble urinating or having a bowel movement after surgery.  Pneumonia.  Blood clots.  The hernia coming back after surgery.  Fluid buildup in the area of the hernia. In some cases, your health care provider may need to switch from a laparoscopic procedure to a procedure that is done through a single, larger incision in the abdomen (open procedure). You may need an open procedure if:  You have a hernia that is difficult to repair.  Your organs are hard to see.  You have bleeding problems during the laparoscopic procedure. What happens before the procedure? Staying hydrated  Follow instructions from your health care provider about hydration, which may include:  Up to 2 hours before the  procedure - you may continue to drink clear liquids, such as water, clear fruit juice, black coffee, and plain tea. Eating and drinking restrictions  Follow instructions from your health care provider about eating and drinking, which may include:  8 hours before the procedure - stop eating heavy meals or foods such as meat, fried foods, or fatty foods.  6 hours before the procedure - stop eating light meals or foods, such as toast or cereal.  6 hours before the procedure - stop drinking milk or drinks that contain milk.  2 hours before the procedure - stop drinking clear liquids. Medicines   Ask your health care provider about:  Changing or stopping your regular medicines. This is especially important if you are taking diabetes medicines or blood thinners.  Taking medicines such as aspirin and ibuprofen. These medicines can thin your blood. Do not take these medicines before your procedure if your health care provider instructs you not to.  You may be given antibiotic medicine to help prevent infection. General instructions   You may be asked to take a laxative or do an enema to empty your bowel before surgery (bowel prep).  Do not use any products that contain nicotine or tobacco, such as cigarettes and e-cigarettes. If you need help quitting, ask your health care provider.  You may need to have tests before the procedure, such as:  Blood tests.  Urine tests.  Abdominal ultrasound.  Chest X-ray.  Electrocardiogram (ECG).  Plan to have someone take you home from the hospital or clinic.  If you will be going home right after the procedure, plan to have someone with you for 24 hours. What happens during the procedure?  To reduce your risk of infection:  Your health care team will wash or sanitize their hands.  Your skin will be washed with soap.  An IV tube will be inserted into one of your veins.  You will be given one or more of the following:  A medicine to help  you relax (sedative).  A medicine to make you fall asleep (general anesthetic).  A small incision will be made in your abdomen. A hollow metal tube (trocar) will be placed through the incision.  A tube will be placed through the trocar to inflate your abdomen with air-like gas. This makes it easier for your surgeon to see inside your abdomen and do the repair.  The laparoscope will be inserted into your abdomen through the trocar. The laparoscope will send images to a monitor in the operating room.  Other trocars will be put through other small incisions in your abdomen. The surgical instruments needed for the procedure will be placed through these trocars.  The tissue or intestines that make up the hernia will be moved back into  place.  The edges of the hernia may be stitched together.  A piece of mesh will be used to close the hernia. Stitches (sutures), clips, or staples will be used to keep the mesh in place.  A bandage (dressing) or skin glue will be put over the incisions. The procedure may vary among health care providers and hospitals. What happens after the procedure?  Your blood pressure, heart rate, breathing rate, and blood oxygen level will be monitored until the medicines you were given have worn off.  You will continue to receive fluids and medicines through an IV tube. Your IV tube will be removed when you can drink clear fluids.  You will be given pain medicine as needed.  You will be encouraged to get up and walk around as soon as possible.  You may have to wear compression stockings. These stockings help to prevent blood clots and reduce swelling in your legs.  You will be shown how to do deep breathing exercises to help prevent a lung infection.  Do not drive for 24 hours if you were given a sedative. This information is not intended to replace advice given to you by your health care provider. Make sure you discuss any questions you have with your health care  provider. Document Released: 08/30/2012 Document Revised: 04/30/2016 Document Reviewed: 04/30/2016 Elsevier Interactive Patient Education  2017 [employer]

## 2016-12-13 NOTE — Discharge Summary (Signed)
[facility] Discharge Summary   Patient ID: Krystal Reeves MRN: [id number] DOB/AGE: 31/12/1985 30 y.o.  Admit date: 12/10/2016 Discharge date: 12/13/2016  Admitting Diagnosis: fat containing incarcerated ventral hernia  Discharge Diagnosis Patient Active Problem List   Diagnosis Date Noted  . Incarcerated ventral hernia 12/10/2016    Consultants none  Imaging: No results found.  Procedures Dr. Lindie SpruceWyatt (12/11/16) - LAPAROSCOPIC INCISIONAL HERNIA REPAIR WITH MESH   Hospital Course:  Krystal Reeves is a 31 year old female [facility].  Workup showed fat containing incarcerated ventral hernia.  Patient was admitted and underwent procedure listed above.  Tolerated procedure well and was transferred to the floor.  Diet was advanced as tolerated.  On POD#2, the patient was voiding well, tolerating diet, ambulating well, pain well controlled, vital signs stable, incisions c/d/i and felt stable for discharge home.  Patient will follow up in our office in 2 weeks and knows to call with questions or concerns. She will call to confirm appointment date/time.    Patient was discharged in good condition.  The [place] Substance controlled database was reviewed prior to prescribing narcotic pain medication to this patient.  PE: Gen:  Alert, NAD, pleasant, cooperative, well appearing Card:  RRR, no M/G/R heard Pulm:  CTA, no W/R/R, effort normal Abd: Soft, not distended, +BS, incisions C/D/I, mild generalized TTP Skin: no rashes noted, warm and dry  Allergies as of 12/13/2016   No Known Allergies     Medication List    TAKE these medications   escitalopram 20 MG tablet Commonly known as:  LEXAPRO Take 20 mg by mouth daily.   oxyCODONE 5 MG immediate release tablet Commonly known as:  Oxy IR/ROXICODONE Take 1 tablet (5 mg total) by mouth every 4 (four) hours as needed for moderate pain (5 mg for moderate pain, 10mg  for severe pain).         Follow-up Information    [facility] [place]. Call.   Specialty:  General Surgery Why:  our office to schedule an appointment in 2-3 weeks for follow up Contact information: [address] [place] [id number] [phone]          Signed: Joyce CopaJessica L [facility] 12/13/2016, 9:42 AM Pager: [phone] Consults: [phone] Mon-Fri 7:00 am-4:30 pm Sat-Sun 7:00 am-11:30 am

## 2016-12-13 NOTE — Progress Notes (Signed)
[facility] Progress Note  2 Days Post-Op  Subjective: Pain controlled. Tolerating diet. Having flatus. No acute events overnight  Objective: Vital signs in last 24 hours: Temp:  [97.5 F (36.4 C)-98.1 F (36.7 C)] 97.5 F (36.4 C) (03/19 0500) Pulse Rate:  [75-86] 75 (03/19 0500) Resp:  [18] 18 (03/19 0500) BP: (120-121)/(73-83) 121/83 (03/19 0500) SpO2:  [99 %] 99 % (03/19 0500) Last BM Date: 12/11/16  Intake/Output from previous day: 03/18 0701 - 03/19 0700 In: 118 [P.O.:118] Out: -  Intake/Output this shift: No intake/output data recorded.  PE: Gen:  Alert, NAD, pleasant, cooperative, well appearing Card:  RRR, no M/G/R heard Pulm:  CTA, no W/R/R, effort normal Abd: Soft, not distended, +BS, incisions C/D/I, mild generalized TTP Skin: no rashes noted, warm and dry  Lab Results:   Recent Labs  12/10/16 1039 12/11/16 0517  WBC 10.5 10.4  HGB 13.7 13.5  HCT 41.9 41.5  PLT 299 257   BMET  Recent Labs  12/10/16 1039 12/11/16 0517  NA 138 139  K 3.3* 3.6  CL 108 107  CO2 21* 25  GLUCOSE 114* 90  BUN <5* <5*  CREATININE 0.74 0.75  CALCIUM 9.1 8.6*   PT/INR No results for input(s): LABPROT, INR in the last 72 hours. CMP     Component Value Date/Time   NA 139 12/11/2016 0517   NA 138 01/07/2015 1919   K 3.6 12/11/2016 0517   K 3.9 01/07/2015 1919   CL 107 12/11/2016 0517   CL 107 01/07/2015 1919   CO2 25 12/11/2016 0517   CO2 24 01/07/2015 1919   GLUCOSE 90 12/11/2016 0517   GLUCOSE 90 01/07/2015 1919   BUN <5 (L) 12/11/2016 0517   BUN 8 01/07/2015 1919   CREATININE 0.75 12/11/2016 0517   CREATININE 0.76 01/07/2015 1919   CALCIUM 8.6 (L) 12/11/2016 0517   CALCIUM 9.4 01/07/2015 1919   PROT 7.4 12/10/2016 1039   PROT 7.7 01/07/2015 1919   ALBUMIN 3.5 12/10/2016 1039   ALBUMIN 4.1 01/07/2015 1919   AST 25 12/10/2016 1039   AST 20 01/07/2015 1919   ALT 27 12/10/2016 1039   ALT 19 01/07/2015 1919   ALKPHOS 79 12/10/2016 1039    ALKPHOS 63 01/07/2015 1919   BILITOT 0.6 12/10/2016 1039   BILITOT 0.2 (L) 01/07/2015 1919   GFRNONAA >60 12/11/2016 0517   GFRNONAA >60 01/07/2015 1919   GFRAA >60 12/11/2016 0517   GFRAA >60 01/07/2015 1919   Lipase     Component Value Date/Time   LIPASE 15 10/12/2016 1235   LIPASE 30 01/07/2015 1919       Studies/Results: No results found.  Anti-infectives: Anti-infectives    Start     Dose/Rate Route Frequency Ordered Stop   12/11/16 1045  polymyxin B 500,000 Units, bacitracin 50,000 Units in sodium chloride irrigation 0.9 % 500 mL irrigation  Status:  Discontinued       As needed 12/11/16 1052 12/11/16 1131   12/11/16 0600  ceFAZolin (ANCEF) IVPB 2g/100 mL premix  Status:  Discontinued     2 g 200 mL/hr over 30 Minutes Intravenous On call to O.R. 12/10/16 1521 12/11/16 1239       Assessment/Plan  Incarcerated ventral hernia s/p Procedure(s): LAPAROSCOPIC INCISIONAL HERNIA REPAIR WITH MESH, 12/11/16, Dr. Lindie Spruce  FEN: reg diet VTE: SCD's ID: none  Plan: Doing well. Pain controlled. DC today    LOS: 1 day    Jerre Simon , [facility] 12/13/2016, 8:15 AM Pager: [phone] Consults: [phone] Mon-Fri 7:00 am-4:30 pm Sat-Sun 7:00 am-11:30 am

## 2016-12-14 NOTE — Progress Notes (Signed)
Krystal Reeves to be D/C'd  per MD order. Discussed with the patient and all questions fully answered.  VSS, Skin clean, dry and intact without evidence of skin break down, no evidence of skin tears noted.  IV catheter discontinued intact. Site without signs and symptoms of complications. Dressing and pressure applied.  An After Visit Summary was printed and given to the patient. Patient received prescription.  D/c education completed with patient/family including follow up instructions, medication list, d/c activities limitations if indicated, with other d/c instructions as indicated by MD - patient able to verbalize understanding, all questions fully answered.   Patient instructed to return to ED, call 911, or call MD for any changes in condition.   Patient to be escorted via WC, and D/C home via private auto.

## 2017-01-09 ENCOUNTER — Encounter (HOSPITAL_COMMUNITY): Payer: Self-pay | Admitting: Emergency Medicine

## 2017-01-09 ENCOUNTER — Emergency Department (HOSPITAL_COMMUNITY): Payer: Commercial Managed Care - PPO

## 2017-01-09 ENCOUNTER — Emergency Department (HOSPITAL_COMMUNITY)
Admission: EM | Admit: 2017-01-09 | Discharge: 2017-01-09 | Disposition: A | Discharge status: Home / Self Care | Payer: Commercial Managed Care - PPO | Attending: Emergency Medicine | Admitting: Emergency Medicine

## 2017-01-09 DIAGNOSIS — R1084 Generalized abdominal pain: Secondary | ICD-10-CM | POA: Insufficient documentation

## 2017-01-09 DIAGNOSIS — Z87891 Personal history of nicotine dependence: Secondary | ICD-10-CM | POA: Insufficient documentation

## 2017-01-09 LAB — COMPREHENSIVE METABOLIC PANEL
ALT: 20 U/L (ref 14–54)
ANION GAP: 6 (ref 5–15)
AST: 20 U/L (ref 15–41)
Albumin: 3.6 g/dL (ref 3.5–5.0)
Alkaline Phosphatase: 85 U/L (ref 38–126)
BUN: 7 mg/dL (ref 6–20)
CHLORIDE: 109 mmol/L (ref 101–111)
CO2: 24 mmol/L (ref 22–32)
Calcium: 9.1 mg/dL (ref 8.9–10.3)
Creatinine, Ser: 0.8 mg/dL (ref 0.44–1.00)
GFR calc Af Amer: >60 mL/min (ref >60)
GFR calc non Af Amer: >60 mL/min (ref >60)
Glucose, Bld: 109 mg/dL — ABNORMAL HIGH (ref 65–99)
POTASSIUM: 3.8 mmol/L (ref 3.5–5.1)
Sodium: 139 mmol/L (ref 135–145)
Total Bilirubin: 0.3 mg/dL (ref 0.3–1.2)
Total Protein: 7.3 g/dL (ref 6.5–8.1)

## 2017-01-09 LAB — LIPASE, BLOOD: LIPASE: 23 U/L (ref 11–51)

## 2017-01-09 LAB — CBC
HEMATOCRIT: 42.2 % (ref 36.0–46.0)
HEMOGLOBIN: 13.5 g/dL (ref 12.0–15.0)
MCH: 28.1 pg (ref 26.0–34.0)
MCHC: 32 g/dL (ref 30.0–36.0)
MCV: 87.7 fL (ref 78.0–100.0)
Platelets: 302 10*3/uL (ref 150–400)
RBC: 4.81 MIL/uL (ref 3.87–5.11)
RDW: 15.1 % (ref 11.5–15.5)
WBC: 8.6 10*3/uL (ref 4.0–10.5)

## 2017-01-09 LAB — URINALYSIS, ROUTINE W REFLEX MICROSCOPIC
Bilirubin Urine: NEGATIVE
GLUCOSE, UA: NEGATIVE mg/dL
Hgb urine dipstick: NEGATIVE
Ketones, ur: NEGATIVE mg/dL
LEUKOCYTES UA: NEGATIVE
NITRITE: NEGATIVE
PH: 6 (ref 5.0–8.0)
Protein, ur: NEGATIVE mg/dL
SPECIFIC GRAVITY, URINE: 1.021 (ref 1.005–1.030)

## 2017-01-09 LAB — I-STAT BETA HCG BLOOD, ED (MC, WL, AP ONLY): I-stat hCG, quantitative: <5 m[IU]/mL (ref <5)

## 2017-01-09 MED ORDER — IOPAMIDOL (ISOVUE-300) INJECTION 61%
INTRAVENOUS | Status: AC
  Administered 2017-01-09: 100 mL
  Filled 2017-01-09: qty 100

## 2017-01-09 MED ORDER — OXYCODONE-ACETAMINOPHEN 5-325 MG PO TABS: 1.0000 | ORAL_TABLET | ORAL | 0 refills | Status: DC | PRN

## 2017-01-09 MED ORDER — MORPHINE SULFATE (PF) 4 MG/ML IV SOLN
2.0000 mg | Freq: Once | INTRAVENOUS | Status: AC
  Administered 2017-01-09: 2 mg via INTRAVENOUS
  Filled 2017-01-09: qty 1

## 2017-01-09 NOTE — ED Triage Notes (Signed)
Pt c/o right sided abdominal pain onset Friday. Pt had hernia repair 1 month ago. Pt thinks she moved wrong and that is what's causing her pain. Pt denies nausea or vomiting.

## 2017-01-09 NOTE — ED Provider Notes (Signed)
[facility]-EMERG[facility] DEPT Provider Note   CSN: [id number] Arrival date & time: 01/09/17  1211     History   Chief Complaint Chief Complaint  Patient presents with  . Abdominal Pain    HPI Krystal Reeves is a 31 y.o. female.  HPI  Is a 31 year old female comes in today complaining of abdominal pain that began yesterday getting out about time. She has recently had surgery for an incarcerated ventral hernia. She had been doing well until yesterday. She describes the pain that occurred with certain movement. Definitely worsens with movement. She has had not having vomiting. She has not noted fever, chills, UTI symptoms, or cough.  Past Medical History:  Diagnosis Date  . Endometriosis   . Headache     Patient Active Problem List   Diagnosis Date Noted  . Incarcerated ventral hernia 12/10/2016    Past Surgical History:  Procedure Laterality Date  . APPENDECTOMY    . DIAGNOSTIC LAPAROSCOPY    . INCISIONAL HERNIA REPAIR N/A 12/11/2016   Procedure: LAPAROSCOPIC INCISIONAL HERNIA REPAIR WITH MESH;  Surgeon: Jimmye Norman, MD;  Location: [facility] OR;  Service: General;  Laterality: N/A;  . LAPAROSCOPY N/A 07/22/2015   Procedure: LAPAROSCOPY DIAGNOSTIC;  Surgeon: Vena Austria, MD;  Location: [facility] ORS;  Service: Gynecology;  Laterality: N/A;  . TUBAL LIGATION Bilateral     OB History    Gravida Para Term Preterm AB Living   SAB TAB Ectopic Multiple Live Births                   Home Medications    Prior to Admission medications   Medication Sig Start Date End Date Taking? Authorizing Provider  escitalopram (LEXAPRO) 20 MG tablet Take 20 mg by mouth daily.   Yes Historical Provider, MD  oxyCODONE (OXY IR/ROXICODONE) 5 MG immediate release tablet Take 1 tablet (5 mg total) by mouth every 4 (four) hours as needed for moderate pain (5 mg for moderate pain,  for severe pain). Patient not taking: Reported on 01/09/2017 12/13/16   Jerre Simon, PA    Family History No  family history on file.  Social History Social History  Substance Use Topics  . Smoking status: Former Smoker    Packs/day: 0.50    Years: 8.00    Types: Cigarettes  . Smokeless tobacco: Never Used  . Alcohol use Yes     Comment: occasionally     Allergies   Patient has no known allergies.   Review of Systems Review of Systems  All other systems reviewed and are negative.    Physical Exam Updated Vital Signs BP 123/66 (BP Location: Left Arm)   Pulse 88   Temp 98 F (36.7 C) (Oral)   Resp 20   Ht  (1.676 m)   Wt 99.8 kg   LMP 10/09/2016   SpO2 97%   BMI 35.51 kg/m   Physical Exam  Constitutional: She is oriented to person, place, and time. She appears well-developed and well-nourished. No distress.  HENT:  Head: Normocephalic and atraumatic.  Right Ear: External ear normal.  Left Ear: External ear normal.  Nose: Nose normal.  Eyes: Conjunctivae and EOM are normal. Pupils are equal, round, and reactive to light.  Neck: Normal range of motion. Neck supple.  Pulmonary/Chest: Effort normal.  Abdominal:    Musculoskeletal: Normal range of motion.  Neurological: She is alert and oriented to person, place, and time. She  exhibits normal muscle tone. Coordination normal.  Skin: Skin is warm and dry.  Psychiatric: She has a normal mood and affect. Her behavior is normal. Thought content normal.  Nursing note and vitals reviewed.    ED Treatments / Results  Labs (all labs ordered are listed, but only abnormal results are displayed) Labs Reviewed  COMPREHENSIVE METABOLIC PANEL - Abnormal; Notable for the following:       Result Value   Glucose, Bld 109 (*)    All other components within normal limits  URINALYSIS, ROUTINE W REFLEX MICROSCOPIC - Abnormal; Notable for the following:    APPearance HAZY (*)    All other components within normal limits  LIPASE, BLOOD  CBC  I-STAT BETA HCG BLOOD, ED ([facility], [facility], [facility] ONLY)    EKG  EKG Interpretation None         Radiology No results found.  Procedures Procedures (including critical care time)  Medications Ordered in ED Medications  iopamidol (ISOVUE-300) 61 % injection (100 mLs  Contrast Given 01/09/17 1415)     Initial Impression / Assessment and Plan / ED Course  I have reviewed the triage vital signs and the nursing notes.  Pertinent labs & imaging results that were available during my care of the patient were reviewed by me and considered in my medical decision making (see chart for details).     31 year old female presents today with abdominal pain. Began yesterday. She has had recent surgery. Exam here is significant for some right-sided abdominal tenderness palpation. She has a negative pregnancy test. ?omental infarction with ? Recurrent hernia- plan recheck with general surgeon 1-2 days.  rX percocet 5.  Final Clinical Impressions(s) / ED Diagnoses   Final diagnoses:  Generalized abdominal pain    New Prescriptions New Prescriptions   No medications on file     Margarita Grizzle, MD 01/13/17 1402

## 2017-01-09 NOTE — Discharge Instructions (Addendum)
Call [facility] tomorrow for recheck 1-2 days

## 2017-01-19 ENCOUNTER — Encounter (HOSPITAL_COMMUNITY): Payer: Self-pay | Admitting: *Deleted

## 2017-01-19 ENCOUNTER — Emergency Department (HOSPITAL_COMMUNITY)
Admission: EM | Admit: 2017-01-19 | Discharge: 2017-01-19 | Disposition: A | Discharge status: Home / Self Care | Payer: Commercial Managed Care - PPO | Attending: Emergency Medicine | Admitting: Emergency Medicine

## 2017-01-19 ENCOUNTER — Emergency Department (HOSPITAL_COMMUNITY): Payer: Commercial Managed Care - PPO

## 2017-01-19 DIAGNOSIS — Z87891 Personal history of nicotine dependence: Secondary | ICD-10-CM | POA: Insufficient documentation

## 2017-01-19 DIAGNOSIS — K55069 Acute infarction of intestine, part and extent unspecified: Secondary | ICD-10-CM

## 2017-01-19 LAB — CBC WITH DIFFERENTIAL/PLATELET
Basophils Absolute: 0 10*3/uL (ref 0.0–0.1)
Basophils Relative: 0 %
Eosinophils Absolute: 0.2 10*3/uL (ref 0.0–0.7)
Eosinophils Relative: 2 %
HCT: 42.1 % (ref 36.0–46.0)
Hemoglobin: 13.9 g/dL (ref 12.0–15.0)
Lymphocytes Relative: 28 %
Lymphs Abs: 2.5 10*3/uL (ref 0.7–4.0)
MCH: 28.6 pg (ref 26.0–34.0)
MCHC: 33 g/dL (ref 30.0–36.0)
MCV: 86.6 fL (ref 78.0–100.0)
Monocytes Absolute: 0.4 10*3/uL (ref 0.1–1.0)
Monocytes Relative: 4 %
Neutro Abs: 5.9 10*3/uL (ref 1.7–7.7)
Neutrophils Relative %: 66 %
Platelets: 246 10*3/uL (ref 150–400)
RBC: 4.86 MIL/uL (ref 3.87–5.11)
RDW: 14.6 % (ref 11.5–15.5)
WBC: 9 10*3/uL (ref 4.0–10.5)

## 2017-01-19 LAB — COMPREHENSIVE METABOLIC PANEL
ALT: 23 U/L (ref 14–54)
AST: 19 U/L (ref 15–41)
Albumin: 3.6 g/dL (ref 3.5–5.0)
Alkaline Phosphatase: 72 U/L (ref 38–126)
Anion gap: 8 (ref 5–15)
BUN: <5 mg/dL — ABNORMAL LOW (ref 6–20)
CO2: 22 mmol/L (ref 22–32)
Calcium: 9.2 mg/dL (ref 8.9–10.3)
Chloride: 105 mmol/L (ref 101–111)
Creatinine, Ser: 0.69 mg/dL (ref 0.44–1.00)
GFR calc Af Amer: >60 mL/min (ref >60)
GFR calc non Af Amer: >60 mL/min (ref >60)
Glucose, Bld: 92 mg/dL (ref 65–99)
Potassium: 3.9 mmol/L (ref 3.5–5.1)
Sodium: 135 mmol/L (ref 135–145)
Total Bilirubin: 0.5 mg/dL (ref 0.3–1.2)
Total Protein: 7.3 g/dL (ref 6.5–8.1)

## 2017-01-19 LAB — I-STAT BETA HCG BLOOD, ED (MC, WL, AP ONLY): I-stat hCG, quantitative: <5 m[IU]/mL (ref <5)

## 2017-01-19 MED ORDER — OXYCODONE-ACETAMINOPHEN 5-325 MG PO TABS
1.0000 | ORAL_TABLET | ORAL | 0 refills | Status: AC | PRN
Start: 2017-01-19 — End: ?

## 2017-01-19 MED ORDER — IOPAMIDOL (ISOVUE-300) INJECTION 61%
INTRAVENOUS | Status: AC
  Administered 2017-01-19: 100 mL
  Filled 2017-01-19: qty 100

## 2017-01-19 MED ORDER — FENTANYL CITRATE (PF) 100 MCG/2ML IJ SOLN
50.0000 ug | Freq: Once | INTRAMUSCULAR | Status: AC
  Administered 2017-01-19: 50 ug via INTRAVENOUS
  Filled 2017-01-19: qty 2

## 2017-01-19 MED ORDER — OXYCODONE-ACETAMINOPHEN 5-325 MG PO TABS: 1.0000 | ORAL_TABLET | Freq: Once | ORAL | Status: DC

## 2017-01-19 MED ORDER — MORPHINE SULFATE (PF) 4 MG/ML IV SOLN
4.0000 mg | Freq: Once | INTRAVENOUS | Status: AC
  Administered 2017-01-19: 4 mg via INTRAVENOUS
  Filled 2017-01-19: qty 1

## 2017-01-19 NOTE — ED Notes (Signed)
Patient transported to CT 

## 2017-01-19 NOTE — ED Triage Notes (Signed)
Pt was here 10 days ago for a f/u for her recent hernia repair and pain. Pt continues to endorse abdominal pain and nausea.

## 2017-01-19 NOTE — ED Provider Notes (Signed)
[facility] Provider Note   CSN: [id number] Arrival date & time: 01/19/17  4010     History   Chief Complaint Chief Complaint  Patient presents with  . Abdominal Pain    HPI Krystal Reeves is a 31 y.o. female.  HPI   31 year old female presents today with complaints of abdominal pain.  Patient has a significant past medical history of the same.  She was seen on March 16 with abdominal pain secondary to ventral hernia.  She underwent mesh repair on 12/11/2016 by Dr. Lindie Spruce.  She was discharged home at that time in stable condition.  Patient returned to the emergency room on 415 with abdominal pain.  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  She was given pain medication and discharged home.  Patient notes that she ran out of her pain medication yesterday, continues to have pain.  She denies any fever, vomiting, change in bowel habits.  No swelling to the abdomen.  Tolerating p.o.   Past Medical History:  Diagnosis Date  . Endometriosis   . Headache     Patient Active Problem List   Diagnosis Date Noted  . Incarcerated ventral hernia 12/10/2016    Past Surgical History:  Procedure Laterality Date  . APPENDECTOMY    . DIAGNOSTIC LAPAROSCOPY    . INCISIONAL HERNIA REPAIR N/A 12/11/2016   Procedure: LAPAROSCOPIC INCISIONAL HERNIA REPAIR WITH MESH;  Surgeon: Jimmye Norman, MD;  Location: [facility] OR;  Service: General;  Laterality: N/A;  . LAPAROSCOPY N/A 07/22/2015   Procedure: LAPAROSCOPY DIAGNOSTIC;  Surgeon: Vena Austria, MD;  Location: [facility] ORS;  Service: Gynecology;  Laterality: N/A;  . TUBAL LIGATION Bilateral     OB History    Gravida Para Term Preterm AB Living   SAB TAB Ectopic Multiple Live Births                   Home Medications    Prior to Admission medications   Medication Sig Start Date End Date Taking? Authorizing Provider  escitalopram (LEXAPRO) 20 MG tablet Take 20 mg by mouth daily.    Historical Provider, MD    oxyCODONE (OXY IR/ROXICODONE) 5 MG immediate release tablet Take 1 tablet (5 mg total) by mouth every 4 (four) hours as needed for moderate pain (5 mg for moderate pain,  for severe pain). Patient not taking: Reported on 01/09/2017 12/13/16   Jerre Simon, PA  oxyCODONE-acetaminophen (PERCOCET/ROXICET) 5-325 MG tablet Take 1 tablet by mouth every 4 (four) hours as needed for severe pain. 01/19/17   Eyvonne Mechanic, PA-C    Family History History reviewed. No pertinent family history.  Social History Social History  Substance Use Topics  . Smoking status: Former Smoker    Packs/day: 0.50    Years: 8.00    Types: Cigarettes  . Smokeless tobacco: Never Used  . Alcohol use Yes     Comment: occasionally     Allergies   Patient has no known allergies.   Review of Systems Review of Systems  All other systems reviewed and are negative.    Physical Exam Updated Vital Signs BP (!) 129/92   Pulse 62   Temp 98.4 F (36.9 C) (Oral)   Resp 14   SpO2 97%   Physical Exam  Constitutional: She is oriented to person, place, and time. She appears well-developed and well-nourished.  HENT:  Head: Normocephalic and atraumatic.  Eyes: Conjunctivae are  normal. Pupils are equal, round, and reactive to light. Right eye exhibits no discharge. Left eye exhibits no discharge. No scleral icterus.  Neck: Normal range of motion. No JVD present. No tracheal deviation present.  Pulmonary/Chest: Effort normal. No stridor.  Abdominal:  Tenderness to palpation of the right mid abdomen, no masses or defects noted remainder of abdominal exam benign  Neurological: She is alert and oriented to person, place, and time. Coordination normal.  Psychiatric: She has a normal mood and affect. Her behavior is normal. Judgment and thought content normal.  Nursing note and vitals reviewed.   ED Treatments / Results  Labs (all labs ordered are listed, but only abnormal results are displayed) Labs Reviewed   COMPREHENSIVE METABOLIC PANEL - Abnormal; Notable for the following:       Result Value   BUN <5 (*)    All other components within normal limits  CBC WITH DIFFERENTIAL/PLATELET  I-STAT BETA HCG BLOOD, ED ([facility], [facility], [facility] ONLY)    EKG  EKG Interpretation None       Radiology Ct Abdomen Pelvis W Contrast  Result Date: 01/19/2017 CLINICAL DATA:  Right side abdominal pain and nausea EXAM: CT ABDOMEN AND PELVIS WITH CONTRAST TECHNIQUE: Multidetector CT imaging of the abdomen and pelvis was performed using the standard protocol following bolus administration of intravenous contrast. CONTRAST:  ISOVUE-300 IOPAMIDOL (ISOVUE-300) INJECTION 61% COMPARISON:  01/09/2017 FINDINGS: Lower chest: Lung bases are clear. No effusions. Heart is normal size. Hepatobiliary: No focal hepatic abnormality. Gallbladder unremarkable. Pancreas: No focal abnormality or ductal dilatation. Spleen: No focal abnormality.  Normal size. Adrenals/Urinary Tract: No adrenal abnormality. No focal renal abnormality. No stones or hydronephrosis. Urinary bladder is unremarkable. Stomach/Bowel: Stomach, large and small bowel grossly unremarkable. Vascular/Lymphatic: No evidence of aneurysm or adenopathy. Reproductive: Uterus and adnexa unremarkable. No mass. Bilateral tubal ligation clips noted Other: No free fluid or free air. 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 Small supraumbilical ventral hernia again noted containing fat, stable. Musculoskeletal: No acute bony abnormality. 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 No acute findings in the abdomen or pelvis. Electronically Signed   By: Charlett Nose M.D.   On: 01/19/2017 12:26    Procedures Procedures (including critical care time)  Medications Ordered in ED Medications  morphine 4 MG/ML injection 4 mg (4 mg Intravenous Given  01/19/17 1124)  iopamidol (ISOVUE-300) 61 % injection (100 mLs  Contrast Given 01/19/17 1211)  fentaNYL (SUBLIMAZE) injection 50 mcg (50 mcg Intravenous Given 01/19/17 1311)     Initial Impression / Assessment and Plan / ED Course  I have reviewed the triage vital signs and the nursing notes.  Pertinent labs & imaging results that were available during my care of the patient were reviewed by me and considered in my medical decision making (see chart for details).     [facility]   Final Clinical Impressions(s) / ED Diagnoses   Final diagnoses:  Omental infarction (HCC)    Labs: CBC, CMP, i-STAT beta-hCG  Imaging: CT abdomen and pelvis  Consults:  Therapeutics: Morphine, fentanyl  Discharge Meds: Percocet  Assessment/Plan: 31 year old female presents today with complaints of abdominal pain.  She has reassuring workup, findings consistent with omental infarct.  General surgery consulted, I spoke with mid-level who recommended outpatient follow-up as this will likely resolve on its own.  Patient was given pain medication here which seemed to  improve her symptoms.  She will be discharged home with oral pain medication, general surgery follow-up and strict return precautions.  She verbalized understanding and agreement to today's plan had no further questions or concerns     New Prescriptions New Prescriptions   OXYCODONE-ACETAMINOPHEN (PERCOCET/ROXICET) 5-325 MG TABLET    Take 1 tablet by mouth every 4 (four) hours as needed for severe pain.     Eyvonne Mechanic, PA-C 01/19/17 1419    Raeford Razor, MD 01/20/17 [phone]

## 2017-01-19 NOTE — Discharge Instructions (Signed)
Please read attached information. If you experience any new or worsening signs or symptoms please return to the emergency room for evaluation. Please follow-up with your primary care provider or specialist as discussed. Please use medication prescribed only as directed and discontinue taking if you have any concerning signs or symptoms.   °

## 2017-01-19 NOTE — ED Notes (Signed)
Pt is in stable condition upon d/c and ambulates from ED. 

## 2017-02-22 DIAGNOSIS — N83202 Unspecified ovarian cyst, left side: Secondary | ICD-10-CM | POA: Insufficient documentation

## 2017-02-22 DIAGNOSIS — K55069 Acute infarction of intestine, part and extent unspecified: Secondary | ICD-10-CM | POA: Insufficient documentation

## 2017-02-22 DIAGNOSIS — Z87891 Personal history of nicotine dependence: Secondary | ICD-10-CM | POA: Insufficient documentation

## 2017-02-22 DIAGNOSIS — R1033 Periumbilical pain: Secondary | ICD-10-CM | POA: Insufficient documentation

## 2017-02-22 NOTE — ED Triage Notes (Addendum)
Pt c/o abdominal pain and tenderness on right side for 3 weeks. Pt had mesh for Hernia put in on mid left abdomen, ever since then it has been hurting. Worse when moving or breathing deeply. Denies N/V/D.

## 2017-02-23 ENCOUNTER — Encounter (HOSPITAL_COMMUNITY): Payer: Self-pay

## 2017-02-23 ENCOUNTER — Emergency Department (HOSPITAL_COMMUNITY): Payer: Commercial Managed Care - PPO

## 2017-02-23 ENCOUNTER — Emergency Department (HOSPITAL_COMMUNITY)
Admission: EM | Admit: 2017-02-23 | Discharge: 2017-02-23 | Disposition: A | Discharge status: Home / Self Care | Payer: Commercial Managed Care - PPO | Attending: Emergency Medicine | Admitting: Emergency Medicine

## 2017-02-23 DIAGNOSIS — N83202 Unspecified ovarian cyst, left side: Secondary | ICD-10-CM

## 2017-02-23 DIAGNOSIS — K55069 Acute infarction of intestine, part and extent unspecified: Secondary | ICD-10-CM

## 2017-02-23 DIAGNOSIS — R1033 Periumbilical pain: Secondary | ICD-10-CM

## 2017-02-23 LAB — COMPREHENSIVE METABOLIC PANEL
ALK PHOS: 73 U/L (ref 38–126)
ALT: 28 U/L (ref 14–54)
ANION GAP: 10 (ref 5–15)
AST: 20 U/L (ref 15–41)
Albumin: 4.1 g/dL (ref 3.5–5.0)
BILIRUBIN TOTAL: 0.7 mg/dL (ref 0.3–1.2)
BUN: 15 mg/dL (ref 6–20)
CALCIUM: 9 mg/dL (ref 8.9–10.3)
CO2: 21 mmol/L — ABNORMAL LOW (ref 22–32)
Chloride: 106 mmol/L (ref 101–111)
Creatinine, Ser: 0.82 mg/dL (ref 0.44–1.00)
GLUCOSE: 97 mg/dL (ref 65–99)
POTASSIUM: 4.1 mmol/L (ref 3.5–5.1)
Sodium: 137 mmol/L (ref 135–145)
TOTAL PROTEIN: 8.2 g/dL — AB (ref 6.5–8.1)

## 2017-02-23 LAB — CBC WITH DIFFERENTIAL/PLATELET
BASOS PCT: 0 %
Basophils Absolute: 0 10*3/uL (ref 0.0–0.1)
Eosinophils Absolute: 0.1 10*3/uL (ref 0.0–0.7)
Eosinophils Relative: 1 %
HEMATOCRIT: 42.2 % (ref 36.0–46.0)
HEMOGLOBIN: 14.5 g/dL (ref 12.0–15.0)
LYMPHS ABS: 3.6 10*3/uL (ref 0.7–4.0)
Lymphocytes Relative: 26 %
MCH: 29.7 pg (ref 26.0–34.0)
MCHC: 34.4 g/dL (ref 30.0–36.0)
MCV: 86.3 fL (ref 78.0–100.0)
MONO ABS: 0.7 10*3/uL (ref 0.1–1.0)
MONOS PCT: 5 %
NEUTROS ABS: 9.7 10*3/uL — AB (ref 1.7–7.7)
NEUTROS PCT: 68 %
Platelets: 282 10*3/uL (ref 150–400)
RBC: 4.89 MIL/uL (ref 3.87–5.11)
RDW: 15.1 % (ref 11.5–15.5)
WBC: 14.1 10*3/uL — ABNORMAL HIGH (ref 4.0–10.5)

## 2017-02-23 LAB — POC URINE PREG, ED: PREG TEST UR: NEGATIVE

## 2017-02-23 LAB — LIPASE, BLOOD: LIPASE: 23 U/L (ref 11–51)

## 2017-02-23 MED ORDER — IOPAMIDOL (ISOVUE-300) INJECTION 61%
100.0000 mL | Freq: Once | INTRAVENOUS | Status: AC | PRN
  Administered 2017-02-23: 100 mL via INTRAVENOUS

## 2017-02-23 MED ORDER — OXYCODONE-ACETAMINOPHEN 5-325 MG PO TABS
1.0000 | ORAL_TABLET | Freq: Once | ORAL | Status: AC
  Administered 2017-02-23: 1 via ORAL
  Filled 2017-02-23: qty 1

## 2017-02-23 MED ORDER — IOPAMIDOL (ISOVUE-300) INJECTION 61%
INTRAVENOUS | Status: AC
  Filled 2017-02-23: qty 100

## 2017-02-23 MED ORDER — NAPROXEN 500 MG PO TABS: 500.0000 mg | ORAL_TABLET | Freq: Two times a day (BID) | ORAL | 0 refills | Status: AC

## 2017-02-23 NOTE — ED Provider Notes (Signed)
[facility]-EMERGENCY DEPT Provider Note   CSN: [id number] Arrival date & time: 02/22/17  2135  By signing my name below, I, Krystal Reeves, attest that this documentation has been prepared under the direction and in the presence of Krystal Reeves, Mayer Masker, MD. Electronically Signed: Cynda Reeves, Scribe. 02/23/17. 2:42 AM.   History   Chief Complaint Chief Complaint  Patient presents with  . Abdominal Pain    Mesh implant   HPI Comments: Krystal Reeves is a 31 y.o. female with a history of an incarcerated ventral hernia resulting in a laparoscopic hernia repair, who presents to the Emergency Department complaining of  constant right sided abdominal pain that began 3-4 weeks ago. Patient reports having a ventral hernia repair 2 months ago. Patient initially did not have pain, but has had pain on the right side for the past 3 weeks. Patient states she has been unable to see her surgeon due to financial issues. Patient reports going to [facility] multiple times, but states her pain never improved with the treatment they provided. Patient was advised to follow-up with her surgeon, but she did not. Patient reports taking naproxen with no relief in pain. Patient describes her pain as sharp and painful to the touch. Patient denies any fever, chills, nausea, vomiting, numbness, or weakness.   Patient was seen twice in April. Most recent CT scan April 25 showed no interval change of likely an omental infarct. Patient has not followed up as recommended with surgery.  The history is provided by the patient. No language interpreter was used.    Past Medical History:  Diagnosis Date  . Endometriosis   . Headache     Patient Active Problem List   Diagnosis Date Noted  . Incarcerated ventral hernia 12/10/2016    Past Surgical History:  Procedure Laterality Date  . APPENDECTOMY    . DIAGNOSTIC LAPAROSCOPY    . INCISIONAL HERNIA REPAIR N/A 12/11/2016   Procedure: LAPAROSCOPIC INCISIONAL HERNIA REPAIR WITH  MESH;  Surgeon: Krystal Norman, MD;  Location: [facility] OR;  Service: General;  Laterality: N/A;  . LAPAROSCOPY N/A 07/22/2015   Procedure: LAPAROSCOPY DIAGNOSTIC;  Surgeon: Krystal Austria, MD;  Location: [facility] ORS;  Service: Gynecology;  Laterality: N/A;  . TUBAL LIGATION Bilateral     OB History    Gravida Para Term Preterm AB Living   3 3 3          SAB TAB Ectopic Multiple Live Births                   Home Medications    Prior to Admission medications   Medication Sig Start Date End Date Taking? Authorizing Provider  escitalopram (LEXAPRO) 20 MG tablet Take 20 mg by mouth daily.   Yes [provider]  naproxen (NAPROSYN) 500 MG tablet Take 500 mg by mouth 2 (two) times daily with a meal. 02/07/17  Yes [provider]  naproxen (NAPROSYN) 500 MG tablet Take 1 tablet (500 mg total) by mouth 2 (two) times daily. 02/23/17   Krystal Reeves, Mayer Masker, MD  oxyCODONE (OXY IR/ROXICODONE) 5 MG immediate release tablet Take 1 tablet (5 mg total) by mouth every 4 (four) hours as needed for moderate pain (5 mg for moderate pain, 10mg  for severe pain). Patient not taking: Reported on 01/09/2017 12/13/16   Krystal Simon, PA  oxyCODONE-acetaminophen (PERCOCET/ROXICET) 5-325 MG tablet Take 1 tablet by mouth every 4 (four) hours as needed for severe pain. Patient not taking: Reported on 02/23/2017 01/19/17  Krystal Reeves, Jeffrey, PA-C    Family History No family history on file.  Social History Social History  Substance Use Topics  . Smoking status: Former Smoker    Packs/day: 0.50    Years: 8.00    Types: Cigarettes  . Smokeless tobacco: Never Used  . Alcohol use Yes     Comment: occasionally     Allergies   Patient has no known allergies.   Review of Systems Review of Systems  Constitutional: Negative for chills and fever.  Respiratory: Negative for shortness of breath.   Cardiovascular: Negative for chest pain.  Gastrointestinal: Positive for abdominal pain. Negative for  diarrhea, nausea and vomiting.  Neurological: Negative for weakness and numbness.  All other systems reviewed and are negative.    Physical Exam Updated Vital Signs BP 118/68 (BP Location: Right Arm)   Pulse 74   Temp 98.4 F (36.9 C) (Oral)   Resp 20   Ht 5\' 6"  (1.676 m)   Wt 99.8 kg (220 lb)   SpO2 100%   BMI 35.51 kg/m   Physical Exam  Constitutional: She is oriented to person, place, and time. She appears well-developed and well-nourished.  HENT:  Head: Normocephalic and atraumatic.  Cardiovascular: Normal rate, regular rhythm and normal heart sounds.   No murmur heard. Pulmonary/Chest: Effort normal and breath sounds normal. No respiratory distress. She has no wheezes.  Abdominal: Soft. Bowel sounds are normal. She exhibits no mass. There is tenderness. There is no guarding.  Multiple laparoscopic well-healed incisions, no overlying skin changes, tenderness to palpation just right of the umbilicus, no bulging noted  Neurological: She is alert and oriented to person, place, and time.  Skin: Skin is warm and dry.  Psychiatric: She has a normal mood and affect.  Nursing note and vitals reviewed.    ED Treatments / Results  DIAGNOSTIC STUDIES: Oxygen Saturation is 100% on RA, normal by my interpretation.    COORDINATION OF CARE: 12:27 AM Discussed treatment plan with pt at bedside and pt agreed to plan.  Labs (all labs ordered are listed, but only abnormal results are displayed) Labs Reviewed  CBC WITH DIFFERENTIAL/PLATELET - Abnormal; Notable for the following:       Result Value   WBC 14.1 (*)    Neutro Abs 9.7 (*)    All other components within normal limits  COMPREHENSIVE METABOLIC PANEL - Abnormal; Notable for the following:    CO2 21 (*)    Total Protein 8.2 (*)    All other components within normal limits  LIPASE, BLOOD  POC URINE PREG, ED    EKG  EKG Interpretation None       Radiology Ct Abdomen Pelvis W Contrast  Result Date:  02/23/2017 CLINICAL DATA:  31 year old female with abdominal pain and leukocytosis. EXAM: CT ABDOMEN AND PELVIS WITH CONTRAST TECHNIQUE: Multidetector CT imaging of the abdomen and pelvis was performed using the standard protocol following bolus administration of intravenous contrast. CONTRAST:  100 cc Isovue-300 COMPARISON:  Abdominal CT dated 01/20/2015 and pelvic ultrasound dated 07/20/2016 FINDINGS: Lower chest: The visualized lung bases are clear. No intra-abdominal free air.  Small free fluid within the pelvis. Hepatobiliary: The liver is unremarkable. No intrahepatic biliary ductal dilatation. The gallbladder is unremarkable. Pancreas: Unremarkable. No pancreatic ductal dilatation or surrounding inflammatory changes. Spleen: Normal in size without focal abnormality. Adrenals/Urinary Tract: The adrenal glands, kidneys, and the visualized ureters appear unremarkable. There is trabecular appearance of the bladder wall which may be related to chronic bladder  outlet obstruction or inflammation. Correlation with urinalysis recommended to exclude acute UTI. Stomach/Bowel: There is no evidence of bowel obstruction or active inflammation. Appendectomy. Vascular/Lymphatic: No significant vascular findings are present. No enlarged abdominal or pelvic lymph nodes. Reproductive: The uterus is retroflexed and appears unremarkable. Bilateral tubal ligation clips noted. There is a 2.5 cm cyst in the left ovary. A 13 x 9 mm ovoid fatty structure in the left posterior hemipelvis (series 2, image 71) likely represents pelvic fat surrounded by fluid and less likely a fat containing lesion such as dermoid. Correlation with pelvic ultrasound recommended. The right ovary is unremarkable as visualized. Other: There is diastases of anterior abdominal wall musculature at the level of the umbilicus. Probable postsurgical changes of hernia repair. There is a small fat containing umbilical hernia as well as a small fat containing  supraumbilical hernia. No inflammatory changes. Stable stranding of the upper mesentery and omentum similar to prior CT, likely related to chronic inflammation and infarct. Musculoskeletal: No acute or significant osseous findings. IMPRESSION: 1. A 2.5 cm left ovarian cyst with small amount of free fluid within the pelvis, new from prior study and may represent cyst rupture. A 13 x 9 mm ovoid fatty structure in the left posterior hemipelvis may represent pelvic floor fat versus less likely a dermoid. Correlation with pelvic ultrasound recommended. 2. No evidence of bowel obstruction or active inflammation. 3. Small fat containing umbilical and supraumbilical hernia as well as chronic changes of the upper mesentery and omentum. Electronically Signed   By: Elgie Collard M.D.   On: 02/23/2017 02:27    Procedures Procedures (including critical care time)  Medications Ordered in ED Medications  iopamidol (ISOVUE-300) 61 % injection (not administered)  oxyCODONE-acetaminophen (PERCOCET/ROXICET) 5-325 MG per tablet 1 tablet (1 tablet Oral Given 02/23/17 0046)  iopamidol (ISOVUE-300) 61 % injection 100 mL (100 mLs Intravenous Contrast Given 02/23/17 0201)     Initial Impression / Assessment and Plan / ED Course  I have reviewed the triage vital signs and the nursing notes.  Pertinent labs & imaging results that were available during my care of the patient were reviewed by me and considered in my medical decision making (see chart for details).    Patient presents with abdominal pain. Acute on chronic following hernia repair surgery. She had 2 CT scans in April which showed omental infarct. She has not followed up with general surgery.  She is nontoxic-appearing. Tender on palpation without signs of peritonitis. No overlying skin changes. No palpable hernia. Lab work only notable for the leukocytosis with left shift. Because of this and CT findings of omental infarct, repeat CT scan was obtained to  insure no progression or new infection. CT scan is negative. Incidentally found to have a left ovarian cyst. Patient is not tender on exam in the left lower quadrant. She has no other symptoms. She does have a gynecologist. Will follow-up as an outpatient for ultrasound imaging. She also needs to follow-up with general surgery as an outpatient. I stressed this to the patient. She stated understanding.  After history, exam, and medical workup I feel the patient has been appropriately medically screened and is safe for discharge home. Pertinent diagnoses were discussed with the patient. Patient was given return precautions.    Final Clinical Impressions(s) / ED Diagnoses   Final diagnoses:  Periumbilical abdominal pain  Left ovarian cyst  Omental infarction (HCC)    New Prescriptions New Prescriptions   NAPROXEN (NAPROSYN) 500 MG TABLET  Take 1 tablet (500 mg total) by mouth 2 (two) times daily.   I personally performed the services described in this documentation, which was scribed in my presence. The recorded information has been reviewed and is accurate.    Shon Baton, MD 02/23/17 [phone]

## 2017-02-23 NOTE — Discharge Instructions (Signed)
You were seen today for abdominal pain. Regarding her surgery, there are no significant changes on her CT scan. 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 You need follow-up with general surgery as an outpatient. Incidentally were noted to have a left ovarian cyst. Given that your pain is not in this location and you have no other complaints, you need to follow-up with your gynecologist as an outpatient for ultrasound.

## 2017-02-26 NOTE — Addendum Note (Signed)
Addendum  created 02/26/17 1048 by Anum Palecek, MD   Sign clinical note    

## 2017-05-11 ENCOUNTER — Emergency Department (HOSPITAL_COMMUNITY): Payer: Commercial Managed Care - PPO

## 2017-05-11 ENCOUNTER — Encounter (HOSPITAL_COMMUNITY): Payer: Self-pay | Admitting: Emergency Medicine

## 2017-05-11 DIAGNOSIS — Z87891 Personal history of nicotine dependence: Secondary | ICD-10-CM | POA: Insufficient documentation

## 2017-05-11 DIAGNOSIS — S9031XA Contusion of right foot, initial encounter: Secondary | ICD-10-CM | POA: Insufficient documentation

## 2017-05-11 DIAGNOSIS — Z79899 Other long term (current) drug therapy: Secondary | ICD-10-CM | POA: Insufficient documentation

## 2017-05-11 DIAGNOSIS — Y9389 Activity, other specified: Secondary | ICD-10-CM | POA: Insufficient documentation

## 2017-05-11 DIAGNOSIS — Y929 Unspecified place or not applicable: Secondary | ICD-10-CM | POA: Insufficient documentation

## 2017-05-11 DIAGNOSIS — W010XXA Fall on same level from slipping, tripping and stumbling without subsequent striking against object, initial encounter: Secondary | ICD-10-CM | POA: Insufficient documentation

## 2017-05-11 DIAGNOSIS — Y998 Other external cause status: Secondary | ICD-10-CM | POA: Insufficient documentation

## 2017-05-11 NOTE — ED Triage Notes (Signed)
Pt c/o R foot pain after injury last night while playing with kids, unkn mechanism. Swelling noted.

## 2017-05-12 ENCOUNTER — Emergency Department (HOSPITAL_COMMUNITY)
Admission: EM | Admit: 2017-05-12 | Discharge: 2017-05-12 | Disposition: A | Discharge status: Home / Self Care | Payer: Commercial Managed Care - PPO | Attending: Emergency Medicine | Admitting: Emergency Medicine

## 2017-05-12 DIAGNOSIS — S9031XA Contusion of right foot, initial encounter: Secondary | ICD-10-CM

## 2017-05-12 MED ORDER — IBUPROFEN 400 MG PO TABS: 400.0000 mg | ORAL_TABLET | Freq: Four times a day (QID) | ORAL | 0 refills | Status: AC | PRN

## 2017-05-12 NOTE — Progress Notes (Signed)
Orthopedic Tech Progress Note Patient Details:  Krystal Reeves 10/26/1985 [id number]  Ortho Devices Type of Ortho Device: Crutches, Ace wrap Ortho Device/Splint Location: r ankle ace wrap Ortho Device/Splint Interventions: Ordered, Application, Adjustment   Trinna PostMartinez, [name] 05/12/2017, 1:11 AM

## 2017-05-12 NOTE — ED Provider Notes (Signed)
[facility]EMERGENCY DEPT Provider Note   CSN: [id number] Arrival date & time: 05/11/17  2249     History   Chief Complaint Chief Complaint  Patient presents with  . Foot Injury    HPI Krystal Reeves is a 31 y.o. female.  This a 31 year old female who was playing with her children last night, ranitidine, aspirin, she slept.  She now has pain into the distal aspect of her right foot medially with some swelling.  She states she's been taking ibuprofen using ice, elevating, but is still having significant swelling      Past Medical History:  Diagnosis Date  . Endometriosis   . Headache     Patient Active Problem List   Diagnosis Date Noted  . Incarcerated ventral hernia 12/10/2016    Past Surgical History:  Procedure Laterality Date  . APPENDECTOMY    . DIAGNOSTIC LAPAROSCOPY    . INCISIONAL HERNIA REPAIR N/A 12/11/2016   Procedure: LAPAROSCOPIC INCISIONAL HERNIA REPAIR WITH MESH;  Surgeon: Jimmye Norman, MD;  Location: [facility] OR;  Service: General;  Laterality: N/A;  . LAPAROSCOPY N/A 07/22/2015   Procedure: LAPAROSCOPY DIAGNOSTIC;  Surgeon: Vena Austria, MD;  Location: [facility] ORS;  Service: Gynecology;  Laterality: N/A;  . TUBAL LIGATION Bilateral     OB History    Gravida Para Term Preterm AB Living   3 3 3          SAB TAB Ectopic Multiple Live Births                   Home Medications    Prior to Admission medications   Medication Sig Start Date End Date Taking? Authorizing Provider  escitalopram (LEXAPRO) 20 MG tablet Take 20 mg by mouth daily.    [provider]  ibuprofen (ADVIL,MOTRIN) 400 MG tablet Take 1 tablet (400 mg total) by mouth every 6 (six) hours as needed. 05/12/17   Earley Favor, NP  naproxen (NAPROSYN) 500 MG tablet Take 500 mg by mouth 2 (two) times daily with a meal. 02/07/17   [provider]  naproxen (NAPROSYN) 500 MG tablet Take 1 tablet (500 mg total) by mouth 2 (two) times daily. 02/23/17   Horton, Mayer Masker, MD  oxyCODONE  (OXY IR/ROXICODONE) 5 MG immediate release tablet Take 1 tablet (5 mg total) by mouth every 4 (four) hours as needed for moderate pain (5 mg for moderate pain, 10mg  for severe pain). Patient not taking: Reported on 01/09/2017 12/13/16   Jerre Simon, PA  oxyCODONE-acetaminophen (PERCOCET/ROXICET) 5-325 MG tablet Take 1 tablet by mouth every 4 (four) hours as needed for severe pain. Patient not taking: Reported on 02/23/2017 01/19/17   Eyvonne Mechanic, PA-C    Family History No family history on file.  Social History Social History  Substance Use Topics  . Smoking status: Former Smoker    Packs/day: 0.50    Years: 8.00    Types: Cigarettes  . Smokeless tobacco: Never Used  . Alcohol use Yes     Comment: occasionally     Allergies   Patient has no known allergies.   Review of Systems Review of Systems  Constitutional: Negative for fever.  Musculoskeletal: Positive for joint swelling.  Neurological: Negative for numbness.  All other systems reviewed and are negative.    Physical Exam Updated Vital Signs BP 129/80 (BP Location: Left Arm)   Pulse 80   Temp 99.1 F (37.3 C) (Oral)   Resp 20   Ht 5\' 6"  (1.676 m)  Wt 90.7 kg (200 lb)   SpO2 98%   BMI 32.28 kg/m   Physical Exam  Constitutional: She appears well-developed.  Eyes: Pupils are equal, round, and reactive to light.  Neck: Normal range of motion.  Cardiovascular: Normal rate.   Pulmonary/Chest: Effort normal.  Musculoskeletal: She exhibits edema and tenderness.       Feet:  Skin: Skin is warm.  Nursing note and vitals reviewed.    ED Treatments / Results  Labs (all labs ordered are listed, but only abnormal results are displayed) Labs Reviewed - No data to display  EKG  EKG Interpretation None       Radiology Dg Foot Complete Right  Result Date: 05/11/2017 CLINICAL DATA:  Status post fall, with right foot swelling. Initial encounter. EXAM: RIGHT FOOT COMPLETE - 3+ VIEW COMPARISON:  None.  FINDINGS: A small osseous fragment medial to the distal first metatarsal may reflect traumatic injury of indeterminate age. The joint spaces are preserved. There is no evidence of talar subluxation; the subtalar joint is unremarkable in appearance. Medial soft tissue swelling is noted. IMPRESSION: Small osseous fragment medial to the distal first metatarsal may reflect traumatic injury of indeterminate age. No additional evidence for fracture. Electronically Signed   By: Roanna RaiderJeffery  Chang M.D.   On: 05/11/2017 23:49    Procedures Procedures (including critical care time)  Medications Ordered in ED Medications - No data to display   Initial Impression / Assessment and Plan / ED Course  I have reviewed the triage vital signs and the nursing notes.  Pertinent labs & imaging results that were available during my care of the patient were reviewed by me and considered in my medical decision making (see chart for details).     We will apply Ace bandage.  Patient has been instructed to follow RICE, he has been given crutches to help with ambulation for the next several days.  She's continued to take ibuprofen on a regular basis   Final Clinical Impressions(s) / ED Diagnoses   Final diagnoses:  Contusion of right foot, initial encounter    New Prescriptions New Prescriptions   IBUPROFEN (ADVIL,MOTRIN) 400 MG TABLET    Take 1 tablet (400 mg total) by mouth every 6 (six) hours as needed.     Earley FavorSchulz, Anaalicia Reimann, NP 05/12/17 0053    Earley FavorSchulz, Treyana Sturgell, NP 05/12/17 [phone]    Zadie RhineWickline, Donald, MD 05/12/17 [phone]

## 2017-05-12 NOTE — Discharge Instructions (Signed)
No fractures noted on your x-ray.  Keep the area wrapped with the Ace bandage, elevate as much as possible.  Ice to the area for the next 1-2 days, 20 minutes on 2 hours off.  Follow-up with your PCP as needed

## 2017-05-12 NOTE — ED Notes (Signed)
Ortho tech paged for ace wrap and crutches

## 2018-10-12 IMAGING — CT CT ABD-PELV W/ CM
2 of 4 series · 17 of 46 positions shown, 19 images · IV contrast (Omni 300)
Comparison: None.

CLINICAL DATA: Ventral hernia

EXAM:
CT ABDOMEN AND PELVIS WITH CONTRAST
TECHNIQUE: Multidetector CT imaging of the abdomen and pelvis was performed
using the standard protocol following bolus administration of
intravenous contrast.
CONTRAST:  100mL EHG8WY-EDD IOPAMIDOL (EHG8WY-EDD) INJECTION 61%

[Series 3: a/p w/ 5mm · axial · 0.76mm/px · z∈[+600,+1025]mm · 14 of 93 slices shown, 16 images]
[im 4/93  soft-tissue]
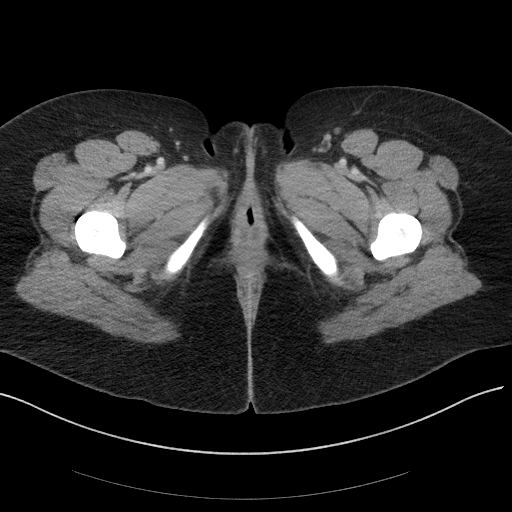
[im 4/93  bone]
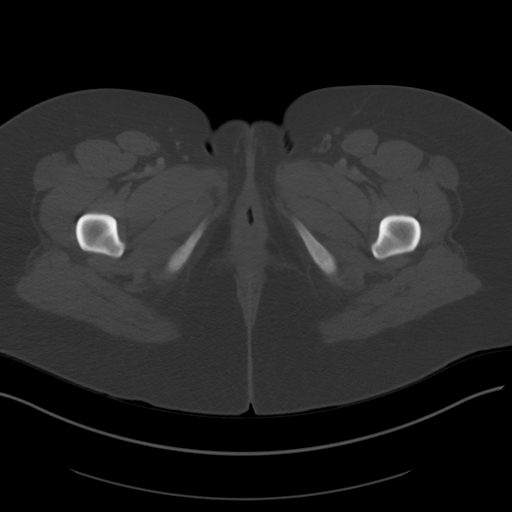
[im 12/93  soft-tissue]
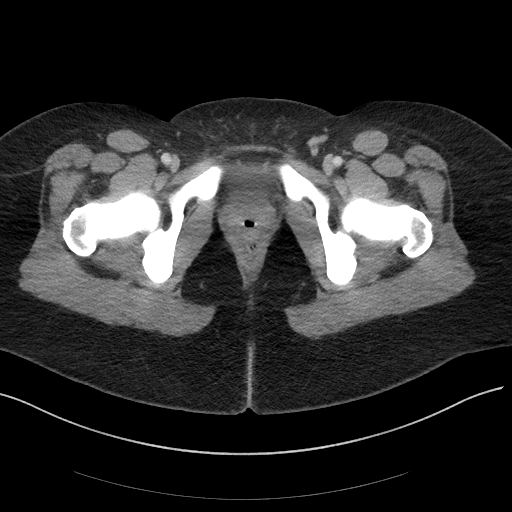
[im 19/93  soft-tissue]
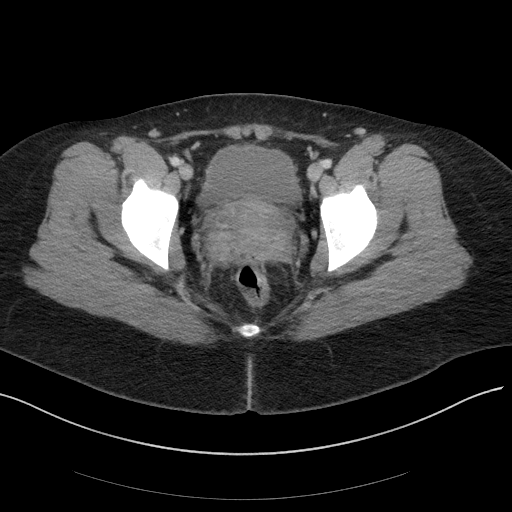
[im 26/93  soft-tissue]
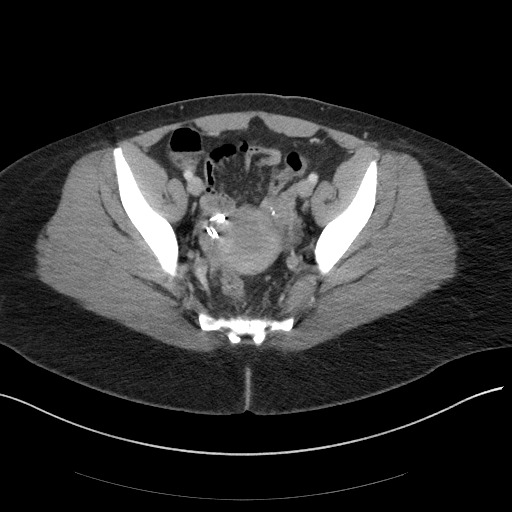
[im 30/93  soft-tissue]
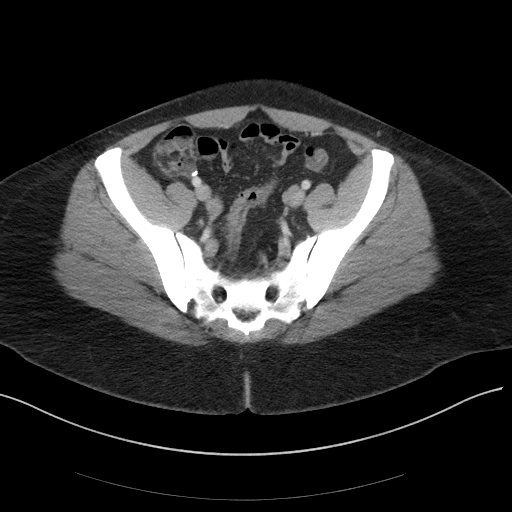
[im 37/93  soft-tissue]
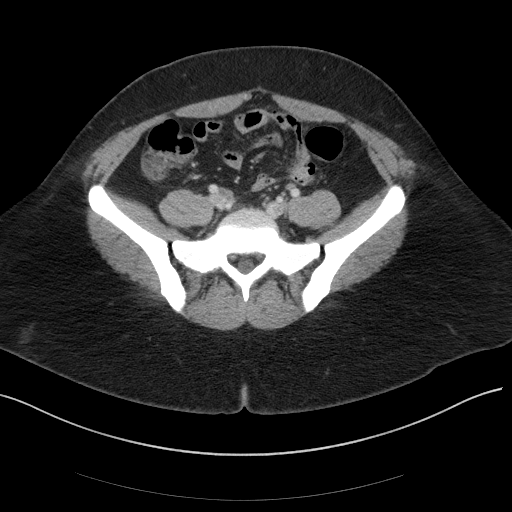
[im 45/93  soft-tissue]
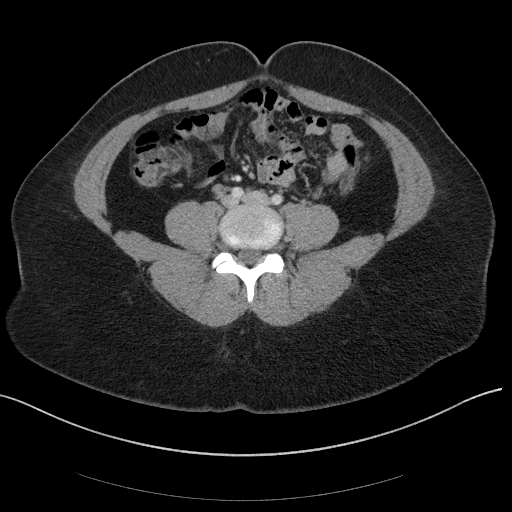
[im 48/93  soft-tissue]
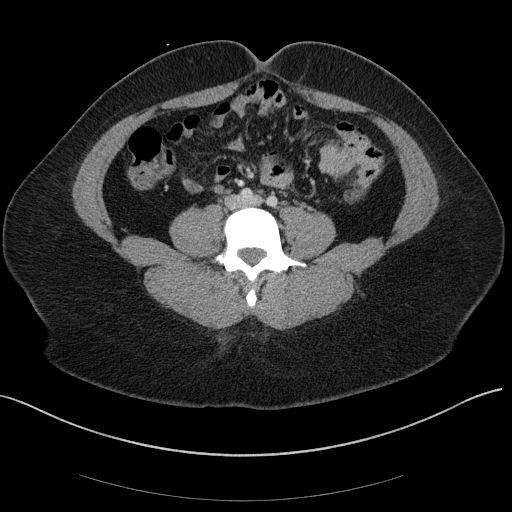
[im 56/93  soft-tissue]
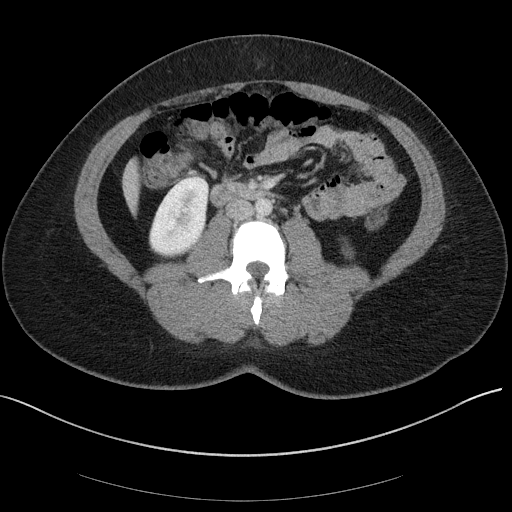
[im 56/93  bone]
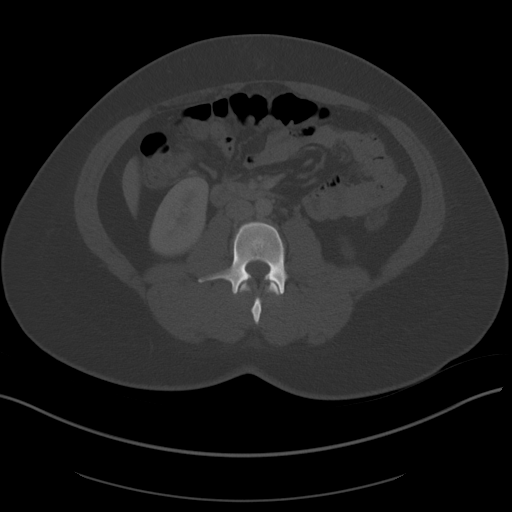
[im 63/93  soft-tissue]
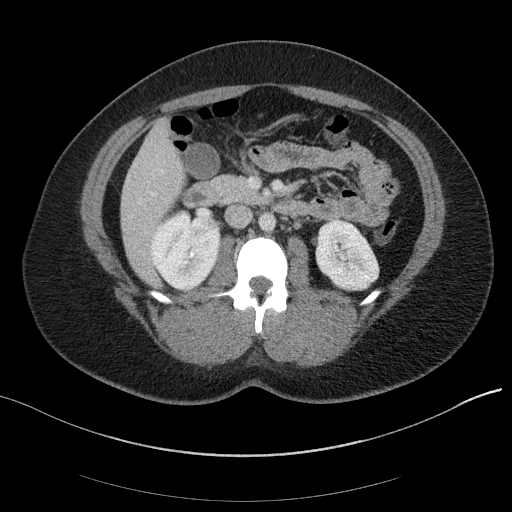
[im 70/93  soft-tissue]
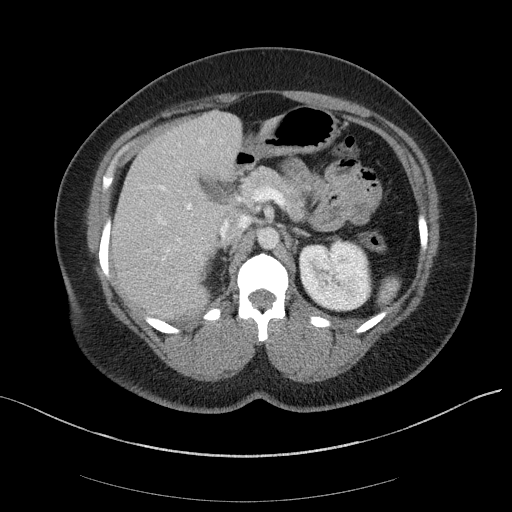
[im 74/93  soft-tissue]
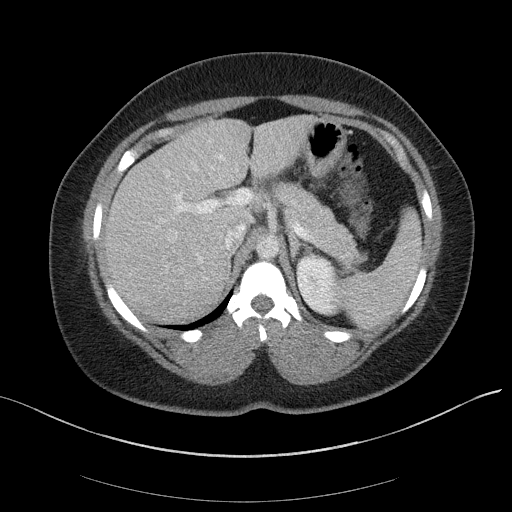
[im 81/93  soft-tissue]
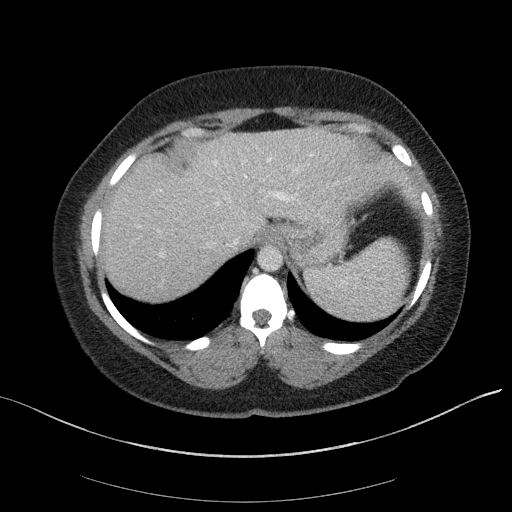
[im 89/93  soft-tissue]
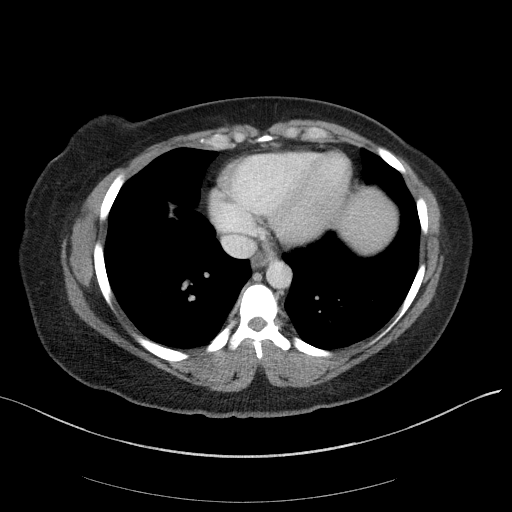

[Series 8: a/p w/ cor · coronal · 0.83mm/px · 3 of 154 slices shown]
[im 52/154  soft-tissue]
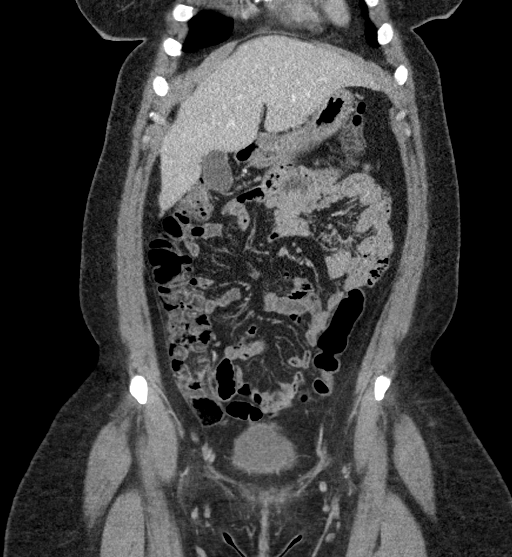
[im 69/154  soft-tissue]
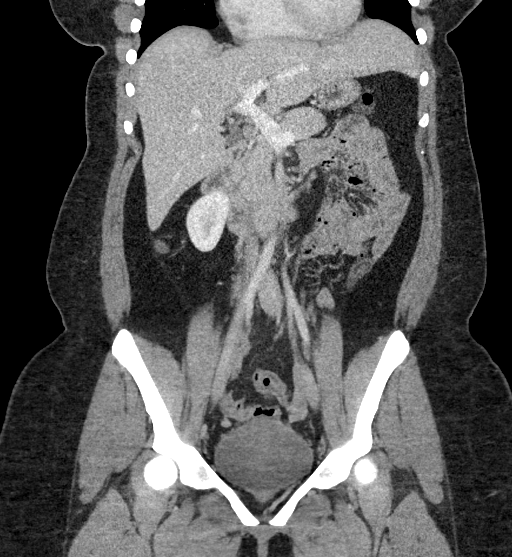
[im 86/154  soft-tissue]
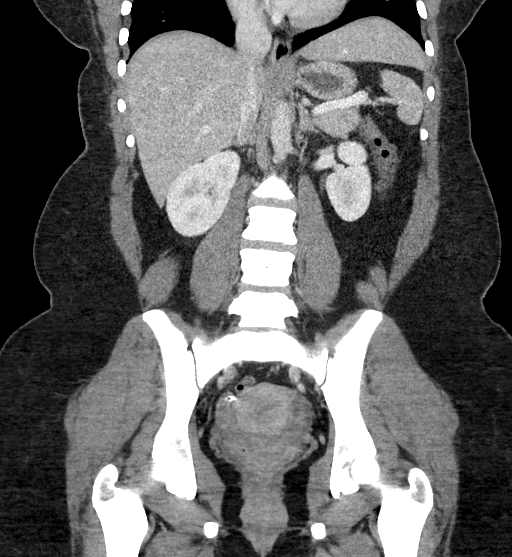

[17 of 46 positions shown; findings below may reference images not displayed]

FINDINGS: Lower chest: No acute abnormality.

Hepatobiliary: No focal liver abnormality is seen. No gallstones,
gallbladder wall thickening, or biliary dilatation.

Pancreas: Unremarkable. No pancreatic ductal dilatation or
surrounding inflammatory changes.

Spleen: Normal in size without focal abnormality.

Adrenals/Urinary Tract: Adrenal glands are unremarkable. Kidneys are
normal, without renal calculi, focal lesion, or hydronephrosis.
Bladder is unremarkable.

Stomach/Bowel: Stomach and small bowel decompressed. Surgical clips
at the base of the cecum. Appendix surgically absent. The colon is
nondilated. No focal inflammatory process or wall thickening
evident.

Vascular/Lymphatic: No significant vascular findings are present. No
enlarged abdominal or pelvic lymph nodes.

Reproductive: Uterus and bilateral adnexa are unremarkable.
Bilateral tubal ligation clips.

Other: No ascites.  No free air.

Musculoskeletal: Small ventral hernia containing only mesenteric
fat. Mild inflammatory/edematous changes around a segment of the fat
centrally within the hernia.
Negative for fracture or other worrisome bone lesion.
IMPRESSION: 1. Small supraumbilical ventral hernia, with edema/ Inflammation
around a portion of involved mesenteric fat.
2. No acute intraabdominal pathology.
